# Patient Record
Sex: Female | Born: 1995 | Race: Black or African American | Hispanic: No | Marital: Single | Smoking: Former smoker
Health system: Southern US, Community
[De-identification: ages and names within clinical notes are randomized; demographics above are authoritative.]

## PROBLEM LIST (undated history)

## (undated) ENCOUNTER — Inpatient Hospital Stay (HOSPITAL_COMMUNITY): Payer: Self-pay

## (undated) DIAGNOSIS — J069 Acute upper respiratory infection, unspecified: Secondary | ICD-10-CM

## (undated) DIAGNOSIS — J302 Other seasonal allergic rhinitis: Secondary | ICD-10-CM

## (undated) DIAGNOSIS — J45909 Unspecified asthma, uncomplicated: Secondary | ICD-10-CM

## (undated) HISTORY — DX: Other seasonal allergic rhinitis: J30.2

## (undated) HISTORY — PX: DILATION AND CURETTAGE OF UTERUS: SHX78

## (undated) HISTORY — DX: Acute upper respiratory infection, unspecified: J06.9

## (undated) HISTORY — DX: Unspecified asthma, uncomplicated: J45.909

---

## 2017-11-26 ENCOUNTER — Ambulatory Visit: Payer: Self-pay | Admitting: Family Medicine

## 2017-11-26 DIAGNOSIS — Z0289 Encounter for other administrative examinations: Secondary | ICD-10-CM

## 2017-11-26 NOTE — Progress Notes (Deleted)
Patient: Joyce Howard MRN: [id number] DOB: [birth year] PCP: No primary care provider on file.     Subjective:  No chief complaint on file.   HPI: The patient is a 22 y.o. female who presents today for annual exam. {He/she (caps):30048} denies any changes to past medical history. There have been no recent hospitalizations. They {Actions; are/are not:16769} following a well balanced diet and exercise plan. Weight has been {trend:16658}. No complaints today.    There is no immunization history on file for this patient. Colonoscopy: Mammogram:  Pap smear:  PSA:   Review of Systems  Allergies Patient has no allergies on file.  Past Medical History Patient  has no past medical history on file.  Surgical History Patient  has no past surgical history on file.  Family History Pateint's family history is not on file.  Social History Patient      Objective: There were no vitals filed for this visit.  There is no height or weight on file to calculate BMI.  Physical Exam     Assessment/plan:    No follow-ups on file.     Orland Mustard, MD [facility]  11/26/2017

## 2017-12-09 ENCOUNTER — Encounter: Payer: Self-pay | Admitting: Family Medicine

## 2018-11-27 ENCOUNTER — Encounter (HOSPITAL_COMMUNITY): Payer: Self-pay

## 2018-11-27 ENCOUNTER — Emergency Department (HOSPITAL_COMMUNITY): Payer: No Typology Code available for payment source

## 2018-11-27 ENCOUNTER — Other Ambulatory Visit: Payer: Self-pay

## 2018-11-27 ENCOUNTER — Emergency Department (HOSPITAL_COMMUNITY)
Admission: EM | Admit: 2018-11-27 | Discharge: 2018-11-27 | Disposition: A | Discharge status: Home / Self Care | Payer: No Typology Code available for payment source | Attending: Emergency Medicine | Admitting: Emergency Medicine

## 2018-11-27 DIAGNOSIS — Y92410 Unspecified street and highway as the place of occurrence of the external cause: Secondary | ICD-10-CM | POA: Diagnosis not present

## 2018-11-27 DIAGNOSIS — M549 Dorsalgia, unspecified: Secondary | ICD-10-CM | POA: Diagnosis not present

## 2018-11-27 DIAGNOSIS — S161XXA Strain of muscle, fascia and tendon at neck level, initial encounter: Secondary | ICD-10-CM | POA: Diagnosis not present

## 2018-11-27 DIAGNOSIS — W2210XA Striking against or struck by unspecified automobile airbag, initial encounter: Secondary | ICD-10-CM | POA: Diagnosis not present

## 2018-11-27 DIAGNOSIS — Y9389 Activity, other specified: Secondary | ICD-10-CM | POA: Diagnosis not present

## 2018-11-27 DIAGNOSIS — S199XXA Unspecified injury of neck, initial encounter: Secondary | ICD-10-CM | POA: Diagnosis present

## 2018-11-27 DIAGNOSIS — Y998 Other external cause status: Secondary | ICD-10-CM | POA: Insufficient documentation

## 2018-11-27 MED ORDER — NAPROXEN 500 MG PO TABS: 500.0000 mg | ORAL_TABLET | Freq: Once | ORAL | Status: DC

## 2018-11-27 MED ORDER — METHOCARBAMOL 500 MG PO TABS: 500.0000 mg | ORAL_TABLET | Freq: Two times a day (BID) | ORAL | 0 refills | Status: DC | PRN

## 2018-11-27 MED ORDER — NAPROXEN 500 MG PO TABS: 500.0000 mg | ORAL_TABLET | Freq: Two times a day (BID) | ORAL | 0 refills | Status: DC

## 2018-11-27 MED ORDER — METHOCARBAMOL 500 MG PO TABS: 500.0000 mg | ORAL_TABLET | Freq: Once | ORAL | Status: DC

## 2018-11-27 NOTE — ED Notes (Signed)
Pt ambulated to and from X ray/CT.
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
## 2018-11-27 NOTE — Discharge Instructions (Addendum)
Alternate ice and heat to areas of injury 3-4 times per day to limit inflammation and spasm.  Avoid strenuous activity and heavy lifting.  We recommend consistent use of naproxen in addition to Robaxin for muscle spasms. Do not drive or drink alcohol after taking Robaxin as it may make you drowsy and impair your judgment.  We recommend follow-up with a primary care doctor to ensure resolution of symptoms.  Return to the ED for any new or concerning symptoms. 

## 2018-11-27 NOTE — ED Notes (Signed)
Pt requesting another C Collar. This RN explained that she does not need a C Collar based on the scan that she received. Her visitor was upset, but patient verbalized understanding and ambulated out of the ED.
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
## 2022-11-10 ENCOUNTER — Ambulatory Visit: Payer: Medicaid Other | Admitting: Obstetrics and Gynecology

## 2022-11-10 ENCOUNTER — Other Ambulatory Visit (HOSPITAL_COMMUNITY)
Admission: RE | Admit: 2022-11-10 | Discharge: 2022-11-10 | Disposition: A | Discharge status: Home / Self Care | Payer: Medicaid Other | Source: Ambulatory Visit | Attending: Obstetrics and Gynecology | Admitting: Obstetrics and Gynecology

## 2022-11-10 ENCOUNTER — Encounter: Payer: Self-pay | Admitting: Obstetrics and Gynecology

## 2022-11-10 VITALS — Wt 146.0 lb

## 2022-11-10 DIAGNOSIS — Z124 Encounter for screening for malignant neoplasm of cervix: Secondary | ICD-10-CM

## 2022-11-10 DIAGNOSIS — Z23 Encounter for immunization: Secondary | ICD-10-CM

## 2022-11-10 DIAGNOSIS — Z113 Encounter for screening for infections with a predominantly sexual mode of transmission: Secondary | ICD-10-CM

## 2022-11-10 DIAGNOSIS — Z01419 Encounter for gynecological examination (general) (routine) without abnormal findings: Secondary | ICD-10-CM

## 2022-11-10 NOTE — Patient Instructions (Signed)
Start folic acid 1 mg daily along with a prenatal vitamin.

## 2022-11-10 NOTE — Progress Notes (Signed)
GYNECOLOGY ANNUAL PREVENTATIVE CARE ENCOUNTER NOTE  History:     Joyce Howard is a 27 y.o. [id number] female here for a routine annual gynecologic exam.  Current complaints: None.   Denies abnormal vaginal bleeding, discharge, pelvic pain, problems with intercourse or other gynecologic concerns.   Trying to conceive at this time.     Gynecologic History Patient's last menstrual period was 11/03/2022. Contraception: none Last Pap: 2023. Result was normal with negative HPV. Pap done in [place], she has records with her today.    Obstetric History OB History  Gravida Para Term Preterm AB Living  1       1    SAB IAB Ectopic Multiple Live Births    1          # Outcome Date GA Lbr Len/2nd Weight Sex Type Anes PTL Lv  1 IAB             Past Medical History:  Diagnosis Date   Seasonal allergies     History reviewed. No pertinent surgical history.  Current Outpatient Medications on File Prior to Visit  Medication Sig Dispense Refill   acetaminophen (TYLENOL) 325 MG tablet Take 650 mg by mouth every 6 (six) hours as needed for moderate pain.     albuterol (VENTOLIN HFA) 108 (90 Base) MCG/ACT inhaler Inhale 2 puffs into the lungs every 6 (six) hours as needed for wheezing or shortness of breath.     cetirizine (ZYRTEC) 10 MG tablet      ergocalciferol (VITAMIN D2) 1.25 MG (50000 UT) capsule Take 50,000 Units by mouth once a week.     fluconazole (DIFLUCAN) 150 MG tablet SMARTSIG:1 Tablet(s) By Mouth Once a Week PRN     fluticasone (FLONASE) 50 MCG/ACT nasal spray Place into both nostrils daily.     vitamin B-12 (CYANOCOBALAMIN) 100 MCG tablet Take 100 mcg by mouth daily.     No current facility-administered medications on file prior to visit.    Allergies  Allergen Reactions   Mushroom Extract Complex Itching, Shortness Of Breath and Swelling   Other Hives, Shortness Of Breath and Swelling    Social History:  reports that she has never smoked. She has never used  smokeless tobacco. She reports that she does not currently use alcohol. She reports that she does not currently use drugs.  Family History  Problem Relation Age of Onset   Stroke Father     The following portions of the patient's history were reviewed and updated as appropriate: allergies, current medications, past family history, past medical history, past social history, past surgical history and problem list.  Review of Systems Pertinent items noted in HPI and remainder of comprehensive ROS otherwise negative.  Physical Exam:  Wt 146 lb (66.2 kg)   LMP 11/03/2022   BMI 25.06 kg/m  CONSTITUTIONAL: Well-developed, well-nourished female in no acute distress.  HENT:  Normocephalic, atraumatic, External right and left ear normal.  EYES: Conjunctivae and EOM are normal. Pupils are equal, round, and reactive to light. No scleral icterus.  NECK: Normal range of motion, supple, no masses.  Normal thyroid.  SKIN: Skin is warm and dry. No rash noted. Not diaphoretic. No erythema. No pallor. MUSCULOSKELETAL: Normal range of motion. No tenderness.  No cyanosis, clubbing, or edema. NEUROLOGIC: Alert and oriented to person, place, and time. Normal reflexes, muscle tone coordination.  PSYCHIATRIC: Normal mood and affect. Normal behavior. Normal judgment and thought content. CARDIOVASCULAR: Normal heart rate noted, regular rhythm RESPIRATORY: Clear to  auscultation bilaterally. Effort and breath sounds normal, no problems with respiration noted. BREASTS: Symmetric in size. No masses, tenderness, skin changes, nipple drainage, or lymphadenopathy bilaterally. Performed in the presence of a chaperone. ABDOMEN: Soft, no distention noted.  No tenderness, rebound or guarding.  PELVIC: Normal appearing external genitalia and urethral meatus; normal appearing vaginal mucosa and cervix.  Mildly enlarged left bartholin glad. No tenderness, redness or drainage. No abnormal vaginal discharge noted.  STD testing  collected. Performed in the presence of a chaperone.   Assessment and Plan:   1. Screening examination for STI  - Cervicovaginal ancillary only( [facility])  2. Women's annual routine gynecological examination  Pap not due today. Records reviewed.  Planning pregnancy, start prenatal vitamins and Folic acid.   Normal breast examination today, she was advised to perform periodic self breast examinations.   Routine preventative health maintenance measures emphasized. Please refer to After Visit Summary for other counseling recommendations.    Jami Bogdanski, Harolyn Rutherford, NP [facility] [employer], [facility]

## 2022-11-10 NOTE — Progress Notes (Signed)
Last pap- 06/05/21- negative Pt feels vaginal lump on the left side Has been TTC for 5 months

## 2022-11-11 LAB — CERVICOVAGINAL ANCILLARY ONLY
Chlamydia: NEGATIVE
Comment: NEGATIVE
Comment: NEGATIVE
Comment: NORMAL
Neisseria Gonorrhea: NEGATIVE
Trichomonas: NEGATIVE

## 2022-12-22 NOTE — Progress Notes (Unsigned)
New Patient Note  RE: Joyce Howard MRN: [id number] DOB: Nov 06, 1995 Date of Office Visit: 12/23/2022  Consult requested by: Laren Boom, DO Primary care provider: Patient, No Pcp Per  Chief Complaint: No chief complaint on file.  History of Present Illness: I had the pleasure of seeing Joyce Howard for initial evaluation at the [facility] on 12/22/2022. She is a 27 y.o. female, who is referred here by Patient, No Pcp Per for the evaluation of allergic rhinitis.  Discussed the use of AI scribe software for clinical note transcription with the patient, who gave verbal consent to proceed.  History of Present Illness             She reports symptoms of ***. Symptoms have been going on for *** years. The symptoms are present *** all year around with worsening in ***. Other triggers include exposure to ***. Anosmia: ***. Headache: ***. She has used *** with ***fair improvement in symptoms. Sinus infections: ***. Previous work up includes: ***. Previous ENT evaluation: ***. Previous sinus imaging: ***. History of nasal polyps: ***. Last eye exam: ***. History of reflux: ***.  Assessment and Plan: Joyce Howard is a 27 y.o. female with: ***  Assessment and Plan               No follow-ups on file.  No orders of the defined types were placed in this encounter.  Lab Orders  No laboratory test(s) ordered today    Other allergy screening: Asthma: {Blank single:19197::"yes","no"} Rhino conjunctivitis: {Blank single:19197::"yes","no"} Food allergy: {Blank single:19197::"yes","no"} Medication allergy: {Blank single:19197::"yes","no"} Hymenoptera allergy: {Blank single:19197::"yes","no"} Urticaria: {Blank single:19197::"yes","no"} Eczema:{Blank single:19197::"yes","no"} History of recurrent infections suggestive of immunodeficency: {Blank single:19197::"yes","no"}  Diagnostics: Spirometry:  Tracings reviewed. Her effort: {Blank  single:19197::"Good reproducible efforts.","It was hard to get consistent efforts and there is a question as to whether this reflects a maximal maneuver.","Poor effort, data can not be interpreted."} FVC: ***L FEV1: ***L, ***% predicted FEV1/FVC ratio: ***% Interpretation: {Blank single:19197::"Spirometry consistent with mild obstructive disease","Spirometry consistent with moderate obstructive disease","Spirometry consistent with severe obstructive disease","Spirometry consistent with possible restrictive disease","Spirometry consistent with mixed obstructive and restrictive disease","Spirometry uninterpretable due to technique","Spirometry consistent with normal pattern","No overt abnormalities noted given today's efforts"}.  Please see scanned spirometry results for details.  Skin Testing: {Blank single:19197::"Select foods","Environmental allergy panel","Environmental allergy panel and select foods","Food allergy panel","None","Deferred due to recent antihistamines use"}. *** Results discussed with patient/family.   Past Medical History: There are no problems to display for this patient.  Past Medical History:  Diagnosis Date   Seasonal allergies    Past Surgical History: No past surgical history on file. Medication List:  Current Outpatient Medications  Medication Sig Dispense Refill   acetaminophen (TYLENOL) 325 MG tablet Take 650 mg by mouth every 6 (six) hours as needed for moderate pain.     albuterol (VENTOLIN HFA) 108 (90 Base) MCG/ACT inhaler Inhale 2 puffs into the lungs every 6 (six) hours as needed for wheezing or shortness of breath.     cetirizine (ZYRTEC) 10 MG tablet      ergocalciferol (VITAMIN D2) 1.25 MG (50000 UT) capsule Take 50,000 Units by mouth once a week.     fluconazole (DIFLUCAN) 150 MG tablet SMARTSIG:1 Tablet(s) By Mouth Once a Week PRN     fluticasone (FLONASE) 50 MCG/ACT nasal spray Place into both nostrils daily.     vitamin B-12 (CYANOCOBALAMIN) 100  MCG tablet Take 100 mcg by mouth daily.     No current  facility-administered medications for this visit.   Allergies: Allergies  Allergen Reactions   Mushroom Extract Complex Itching, Shortness Of Breath and Swelling   Other Hives, Shortness Of Breath and Swelling   Social History: Social History   Socioeconomic History   Marital status: Single    Spouse name: Not on file   Number of children: Not on file   Years of education: Not on file   Highest education level: Not on file  Occupational History   Not on file  Tobacco Use   Smoking status: Never   Smokeless tobacco: Never  Vaping Use   Vaping status: Never Used  Substance and Sexual Activity   Alcohol use: Not Currently   Drug use: Not Currently   Sexual activity: Yes    Birth control/protection: None  Other Topics Concern   Not on file  Social History Narrative   Not on file   Social Determinants of Health   Financial Resource Strain: Not on file  Food Insecurity: Not on file  Transportation Needs: Not on file  Physical Activity: Not on file  Stress: Not on file  Social Connections: Not on file   Lives in a ***. Smoking: *** Occupation: ***  Environmental HistorySurveyor, minerals in the house: Copywriter, advertising in the family room: {Blank single:19197::"yes","no"} Carpet in the bedroom: {Blank single:19197::"yes","no"} Heating: {Blank single:19197::"electric","gas","heat pump"} Cooling: {Blank single:19197::"central","window","heat pump"} Pet: {Blank single:19197::"yes ***","no"}  Family History: Family History  Problem Relation Age of Onset   Stroke Father    Problem                               Relation Asthma                                   *** Eczema                                *** Food allergy                          *** Allergic rhino conjunctivitis     ***  Review of Systems  Constitutional:  Negative for appetite change, chills, fever and unexpected  weight change.  HENT:  Negative for congestion and rhinorrhea.   Eyes:  Negative for itching.  Respiratory:  Negative for cough, chest tightness, shortness of breath and wheezing.   Cardiovascular:  Negative for chest pain.  Gastrointestinal:  Negative for abdominal pain.  Genitourinary:  Negative for difficulty urinating.  Skin:  Negative for rash.  Neurological:  Negative for headaches.    Objective: There were no vitals taken for this visit. There is no height or weight on file to calculate BMI. Physical Exam Vitals and nursing note reviewed.  Constitutional:      Appearance: Normal appearance. She is well-developed.  HENT:     Head: Normocephalic and atraumatic.     Right Ear: Tympanic membrane and external ear normal.     Left Ear: Tympanic membrane and external ear normal.     Nose: Nose normal.     Mouth/Throat:     Mouth: Mucous membranes are moist.     Pharynx: Oropharynx is clear.  Eyes:     Conjunctiva/sclera: Conjunctivae normal.  Cardiovascular:     Rate  and Rhythm: Normal rate and regular rhythm.     Heart sounds: Normal heart sounds. No murmur heard.    No friction rub. No gallop.  Pulmonary:     Effort: Pulmonary effort is normal.     Breath sounds: Normal breath sounds. No wheezing, rhonchi or rales.  Musculoskeletal:     Cervical back: Neck supple.  Skin:    General: Skin is warm.     Findings: No rash.  Neurological:     Mental Status: She is alert and oriented to person, place, and time.  Psychiatric:        Behavior: Behavior normal.    The plan was reviewed with the patient/family, and all questions/concerned were addressed.  It was my pleasure to see Joyce Howard today and participate in her care. Please feel free to contact me with any questions or concerns.  Sincerely,  Wyline Mood, DO Allergy & Immunology  [facility] office: [phone] [facility] office: [phone]

## 2022-12-23 ENCOUNTER — Ambulatory Visit (INDEPENDENT_AMBULATORY_CARE_PROVIDER_SITE_OTHER): Payer: Medicaid Other | Admitting: Allergy

## 2022-12-23 ENCOUNTER — Other Ambulatory Visit: Payer: Self-pay

## 2022-12-23 ENCOUNTER — Encounter: Payer: Self-pay | Admitting: Allergy

## 2022-12-23 VITALS — BP 110/72 | HR 83 | Temp 98.3°F | Resp 12 | Ht 65.0 in | Wt 151.1 lb

## 2022-12-23 DIAGNOSIS — J329 Chronic sinusitis, unspecified: Secondary | ICD-10-CM

## 2022-12-23 DIAGNOSIS — J4530 Mild persistent asthma, uncomplicated: Secondary | ICD-10-CM | POA: Diagnosis not present

## 2022-12-23 DIAGNOSIS — J3089 Other allergic rhinitis: Secondary | ICD-10-CM | POA: Diagnosis not present

## 2022-12-23 DIAGNOSIS — T781XXD Other adverse food reactions, not elsewhere classified, subsequent encounter: Secondary | ICD-10-CM

## 2022-12-23 MED ORDER — FLUTICASONE FUROATE-VILANTEROL 100-25 MCG/ACT IN AEPB: 1.0000 | INHALATION_SPRAY | Freq: Every day | RESPIRATORY_TRACT | 3 refills | Status: DC

## 2022-12-23 MED ORDER — MONTELUKAST SODIUM 10 MG PO TABS: 10.0000 mg | ORAL_TABLET | Freq: Every day | ORAL | 2 refills | Status: DC

## 2022-12-23 NOTE — Patient Instructions (Addendum)
Rhinitis  Return for allergy skin testing. Will make additional recommendations based on results. If significant positives will recommend allergy injections - handout given. Make sure you don't take any antihistamines for 3 days before the skin testing appointment. Don't put any lotion on the back and arms on the day of testing.  Plan on being here for 30-60 minutes.  Use azelastine nasal spray 1-2 sprays per nostril twice a day as needed for runny nose/drainage. STOP this nasal spray 3 days before skin testing. Use Flonase (fluticasone) nasal spray 1-2 sprays per nostril once a day as needed for nasal congestion.  Nasal saline spray (i.e., Simply Saline) or nasal saline lavage (i.e., NeilMed) is recommended as needed and prior to medicated nasal sprays. Start Singulair (montelukast) 10mg  daily at night. Cautioned that in some children/adults can experience behavioral changes including hyperactivity, agitation, depression, sleep disturbances and suicidal ideations. These side effects are rare, but if you notice them you should notify me and discontinue Singulair (montelukast).  Infections Keep track of infections and antibiotics use. If persistent will get bloodwork next to look at immune system.   Asthma Start Singulair (montelukast) 10mg  daily at night as above.  Daily controller medication(s): start Breo 1 puff once a day and rinse mouth after each use. Demonstrated proper use.   May use albuterol rescue inhaler 2 puffs every 4 to 6 hours as needed for shortness of breath, chest tightness, coughing, and wheezing.  Monitor frequency of use - if you need to use it more than twice per week on a consistent basis let us know.  Breathing control goals:  Full participation in all desired activities (may need albuterol before activity) Albuterol use two times or less a week on average (not counting use with activity) Cough interfering with sleep two times or less a month Oral steroids  no more than once a year No hospitalizations   Food Avoid mushrooms. Check skin testing to mushroom at next visit. For mild symptoms you can take over the counter antihistamines such as Benadryl 1-2 tablets = 25-50mg  and monitor symptoms closely.  If symptoms worsen or if you have severe symptoms including breathing issues, throat closure, significant swelling, whole body hives, severe diarrhea and vomiting, lightheadedness then seek immediate medical care.  Follow up for skin testing with me.

## 2022-12-28 ENCOUNTER — Other Ambulatory Visit (HOSPITAL_COMMUNITY): Payer: Self-pay

## 2022-12-28 ENCOUNTER — Telehealth: Payer: Self-pay

## 2022-12-28 MED ORDER — BUDESONIDE-FORMOTEROL FUMARATE 80-4.5 MCG/ACT IN AERO: 2.0000 | INHALATION_SPRAY | Freq: Two times a day (BID) | RESPIRATORY_TRACT | 5 refills | Status: DC

## 2022-12-28 NOTE — Telephone Encounter (Signed)
Pharmacy Patient Advocate Encounter   Received notification from CoverMyMeds that prior authorization for Fluticasone Furoate-Vilanterol 100-25MCG/ACT aerosol powder is required/requested.   Insurance verification completed.   The patient is insured through [facility]   Prior Authorization form/request asks a question that requires your assistance. Please see the question below and advise accordingly.    KEY [id number]

## 2022-12-28 NOTE — Telephone Encounter (Signed)
Joyce Howard is not covered.  Please call patient and tell her that I sent in Symbicort 2 puffs twice a day and rinse mouth after each use.

## 2022-12-29 NOTE — Telephone Encounter (Signed)
Called patient - DOB/Pharmacy verified - advised of provider notation below.  Patient verbalized understanding, no questions.

## 2022-12-29 NOTE — Progress Notes (Unsigned)
Skin testing note  RE: Joyce Howard MRN: [id number] DOB: June 26, 1995 Date of Office Visit: 12/30/2022  Referring provider: No ref. provider found Primary care provider: Patient, No Pcp Per  Chief Complaint: No chief complaint on file.  History of Present Illness: I had the pleasure of seeing Joyce Howard for a skin testing visit at the [facility] on 12/29/2022. She is a 27 y.o. female, who is being followed for allergic rhinitis, recurrent sinusitis, asthma, adverse food reaction. Her previous allergy office visit was on 12/23/2022 with Dr. Selena Batten. Today is a skin testing visit.   Discussed the use of AI scribe software for clinical note transcription with the patient, who gave verbal consent to proceed.  History of Present Illness             Other allergic rhinitis Persistent nasal congestion, postnasal drip, and facial pain for the past year. Recurrent sinus infections and bronchitis. Nasal endoscopy revealed nasal swelling. Current treatment with Flonase and azelastine nasal sprays not providing relief. Return for allergy skin testing. Will make additional recommendations based on results. If significant positives will recommend allergy injections - handout given. Use azelastine nasal spray 1-2 sprays per nostril twice a day as needed for runny nose/drainage. STOP this nasal spray 3 days before skin testing. Use Flonase (fluticasone) nasal spray 1-2 sprays per nostril once a day as needed for nasal congestion.  Nasal saline spray (i.e., Simply Saline) or nasal saline lavage (i.e., NeilMed) is recommended as needed and prior to medicated nasal sprays. Start Singulair (montelukast) 10mg  daily at night. Cautioned that in some children/adults can experience behavioral changes including hyperactivity, agitation, depression, sleep disturbances and suicidal ideations. These side effects are rare, but if you notice them you should notify me and discontinue  Singulair (montelukast).   Recurrent sinusitis Keep track of infections and antibiotics use. If persistent will get bloodwork next to look at immune system.    Mild persistent asthma without complication Reports of wheezing and use of albuterol inhaler up to three times a week. Seems to have gotten worse since she had Covid-19 4 times. Today's spirometry showed normal pattern with 4% (150cc) improvement in FEV1 post bronchodilator treatment. Clinically feeling improved.  Start Singulair (montelukast) 10mg  daily at night as above.  Daily controller medication(s): start Breo 1 puff once a day and rinse mouth after each use. Demonstrated proper use.  May use albuterol rescue inhaler 2 puffs every 4 to 6 hours as needed for shortness of breath, chest tightness, coughing, and wheezing.  Monitor frequency of use - if you need to use it more than twice per week on a consistent basis let us know.    Other adverse food reactions, not elsewhere classified, subsequent encounter Reports of systemic reaction including facial swelling and throat tightness. Last reaction in 2017. Avoid mushrooms. Check skin testing to mushroom at next visit. For mild symptoms you can take over the counter antihistamines such as Benadryl 1-2 tablets = 25-50mg  and monitor symptoms closely.  If symptoms worsen or if you have severe symptoms including breathing issues, throat closure, significant swelling, whole body hives, severe diarrhea and vomiting, lightheadedness then seek immediate medical care. Assessment and Plan: Joyce Howard is a 27 y.o. female with: ***  Assessment and Plan              No follow-ups on file.  No orders of the defined types were placed in this encounter.  Lab Orders  No laboratory test(s) ordered today  Diagnostics: Spirometry:  Tracings reviewed. Her effort: {Blank single:19197::"Good reproducible efforts.","It was hard to get consistent efforts and there is a question as to  whether this reflects a maximal maneuver.","Poor effort, data can not be interpreted."} FVC: ***L FEV1: ***L, ***% predicted FEV1/FVC ratio: ***% Interpretation: {Blank single:19197::"Spirometry consistent with mild obstructive disease","Spirometry consistent with moderate obstructive disease","Spirometry consistent with severe obstructive disease","Spirometry consistent with possible restrictive disease","Spirometry consistent with mixed obstructive and restrictive disease","Spirometry uninterpretable due to technique","Spirometry consistent with normal pattern","No overt abnormalities noted given today's efforts"}.  Please see scanned spirometry results for details.  Skin Testing: Environmental allergy panel and select foods. *** Results discussed with patient/family.   Previous notes and tests were reviewed. The plan was reviewed with the patient/family, and all questions/concerned were addressed.  It was my pleasure to see Joyce Howard today and participate in her care. Please feel free to contact me with any questions or concerns.  Sincerely,  Wyline Mood, DO Allergy & Immunology  [facility] office: [phone] [facility] office: [phone]

## 2022-12-30 ENCOUNTER — Encounter: Payer: Self-pay | Admitting: Allergy

## 2022-12-30 ENCOUNTER — Ambulatory Visit (INDEPENDENT_AMBULATORY_CARE_PROVIDER_SITE_OTHER): Payer: Medicaid Other | Admitting: Allergy

## 2022-12-30 DIAGNOSIS — J3081 Allergic rhinitis due to animal (cat) (dog) hair and dander: Secondary | ICD-10-CM

## 2022-12-30 DIAGNOSIS — J3089 Other allergic rhinitis: Secondary | ICD-10-CM

## 2022-12-30 DIAGNOSIS — T781XXD Other adverse food reactions, not elsewhere classified, subsequent encounter: Secondary | ICD-10-CM

## 2022-12-30 DIAGNOSIS — J329 Chronic sinusitis, unspecified: Secondary | ICD-10-CM

## 2022-12-30 DIAGNOSIS — J4530 Mild persistent asthma, uncomplicated: Secondary | ICD-10-CM

## 2022-12-30 DIAGNOSIS — J301 Allergic rhinitis due to pollen: Secondary | ICD-10-CM

## 2022-12-30 NOTE — Patient Instructions (Addendum)
Today's skin testing positive to grass, mold, cat and borderline to dog. Negative to mushroom.   Results given.  Environmental allergies Start environmental control measures as below. Use over the counter antihistamines such as Zyrtec (cetirizine), Claritin (loratadine), Allegra (fexofenadine), or Xyzal (levocetirizine) daily as needed. May take twice a day during allergy flares. May switch antihistamines every few months. Continue Singulair (montelukast) 10mg  daily at night.  Hold nasal sprays if you have some blood tinged nasal discharge.  Use azelastine nasal spray 1-2 sprays per nostril twice a day as needed for runny nose/drainage. Use Flonase (fluticasone) nasal spray 1-2 sprays per nostril once a day as needed for nasal congestion.  Nasal saline spray (i.e., Simply Saline) or nasal saline lavage (i.e., NeilMed) is recommended as needed and prior to medicated nasal sprays.  Consider allergy injections for long term control if above medications do not help the symptoms.   Nose Bleeds: Nosebleeds are very common.  Site of the bleeding is typically on the septum or at the very front of the nose.  Some of the more common causes are from trauma, inflammation or medication induced. Pinch both nostrils while leaning forward for at least 5 minutes before checking to see if the bleeding has stopped. If bleeding is not controlled within 5-10 minutes apply a cotton ball soaked with oxymetazoline (Afrin) to the bleeding nostril for a few seconds.  Preventative treatment: Apply saline nasal gel in each nostril twice a day for 2 weeks to allow the nasal mucosa to heal Consider using a humidifier in the winter Try to keep your blood pressure as normal as possible (120/80)  Infections Keep track of infections and antibiotics use. If persistent will get bloodwork next to look at immune system.   Asthma Continue Singulair (montelukast) 10mg  daily at night as above.  Daily controller medication(s):  start Symbicort 2 puffs twice a day and rinse mouth after each use.  May use albuterol rescue inhaler 2 puffs every 4 to 6 hours as needed for shortness of breath, chest tightness, coughing, and wheezing.  Monitor frequency of use - if you need to use it more than twice per week on a consistent basis let us know.  Breathing control goals:  Full participation in all desired activities (may need albuterol before activity) Albuterol use two times or less a week on average (not counting use with activity) Cough interfering with sleep two times or less a month Oral steroids no more than once a year No hospitalizations   Food Continue to avoid mushrooms. Will get bloodwork for mushrooms at next visit.  For mild symptoms you can take over the counter antihistamines such as Benadryl 1-2 tablets = 25-50mg  and monitor symptoms closely.  If symptoms worsen or if you have severe symptoms including breathing issues, throat closure, significant swelling, whole body hives, severe diarrhea and vomiting, lightheadedness then seek immediate medical care.  Follow up in 3 months or sooner if needed.   Reducing Pollen Exposure Pollen seasons: trees (spring), grass (summer) and ragweed/weeds (fall). Keep windows closed in your home and car to lower pollen exposure.  Install air conditioning in the bedroom and throughout the house if possible.  Avoid going out in dry windy days - especially early morning. Pollen counts are highest between 5 - 10 AM and on dry, hot and windy days.  Save outside activities for late afternoon or after a heavy rain, when pollen levels are lower.  Avoid mowing of grass if you have grass pollen allergy.  Be aware that pollen can also be transported indoors on people and pets.  Dry your clothes in an automatic dryer rather than hanging them outside where they might collect pollen.  Rinse hair and eyes before bedtime.  Pet Allergen Avoidance: Contrary to popular opinion, there  are no "hypoallergenic" breeds of dogs or cats. That is because people are not allergic to an animal's hair, but to an allergen found in the animal's saliva, dander (dead skin flakes) or urine. Pet allergy symptoms typically occur within minutes. For some people, symptoms can build up and become most severe 8 to 12 hours after contact with the animal. People with severe allergies can experience reactions in public places if dander has been transported on the pet owners' clothing. Keeping an animal outdoors is only a partial solution, since homes with pets in the yard still have higher concentrations of animal allergens. Before getting a pet, ask your allergist to determine if you are allergic to animals. If your pet is already considered part of your family, try to minimize contact and keep the pet out of the bedroom and other rooms where you spend a great deal of time. As with dust mites, vacuum carpets often or replace carpet with a hardwood floor, tile or linoleum. High-efficiency particulate air (HEPA) cleaners can reduce allergen levels over time. While dander and saliva are the source of cat and dog allergens, urine is the source of allergens from rabbits, hamsters, mice and [place] pigs; so ask a non-allergic family member to clean the animal's cage. If you have a pet allergy, talk to your allergist about the potential for allergy immunotherapy (allergy shots). This strategy can often provide long-term relief.  Mold Control Mold and fungi can grow on a variety of surfaces provided certain temperature and moisture conditions exist.  Outdoor molds grow on plants, decaying vegetation and soil. The major outdoor mold, Alternaria and Cladosporium, are found in very high numbers during hot and dry conditions. Generally, a late summer - fall peak is seen for common outdoor fungal spores. Rain will temporarily lower outdoor mold spore count, but counts rise rapidly when the rainy period ends. The most  important indoor molds are Aspergillus and Penicillium. Dark, humid and poorly ventilated basements are ideal sites for mold growth. The next most common sites of mold growth are the bathroom and the kitchen. Outdoor (Seasonal) Mold Control Use air conditioning and keep windows closed. Avoid exposure to decaying vegetation. Avoid leaf raking. Avoid grain handling. Consider wearing a face mask if working in moldy areas.  Indoor (Perennial) Mold Control  Maintain humidity below 50%. Get rid of mold growth on hard surfaces with water, detergent and, if necessary, 5% bleach (do not mix with other cleaners). Then dry the area completely. If mold covers an area more than 10 square feet, consider hiring an indoor environmental professional. For clothing, washing with soap and water is best. If moldy items cannot be cleaned and dried, throw them away. Remove sources e.g. contaminated carpets. Repair and seal leaking roofs or pipes. Using dehumidifiers in damp basements may be helpful, but empty the water and clean units regularly to prevent mildew from forming. All rooms, especially basements, bathrooms and kitchens, require ventilation and cleaning to deter mold and mildew growth. Avoid carpeting on concrete or damp floors, and storing items in damp areas.

## 2022-12-31 ENCOUNTER — Telehealth: Payer: Self-pay

## 2022-12-31 NOTE — Telephone Encounter (Signed)
Patient was seen yesterday by Dr. Selena Batten in the [place] office. Patient stating where her intradermals were done that her arm felt itchy,warm,hot, so I told patient to apply cool compress to her arm, take benadryl every 4-6 hours, apply hydrocortisone 1% cream. Told patient to call office tomorrow if her above symptoms aren't getting any better.

## 2023-01-21 ENCOUNTER — Ambulatory Visit: Payer: Medicaid Other | Admitting: Obstetrics and Gynecology

## 2023-01-21 ENCOUNTER — Other Ambulatory Visit (HOSPITAL_COMMUNITY)
Admission: RE | Admit: 2023-01-21 | Discharge: 2023-01-21 | Disposition: A | Discharge status: Home / Self Care | Payer: Medicaid Other | Source: Ambulatory Visit | Attending: Obstetrics and Gynecology | Admitting: Obstetrics and Gynecology

## 2023-01-21 ENCOUNTER — Encounter: Payer: Self-pay | Admitting: Obstetrics and Gynecology

## 2023-01-21 VITALS — BP 119/79 | HR 84 | Ht 65.0 in | Wt 152.0 lb

## 2023-01-21 DIAGNOSIS — Z113 Encounter for screening for infections with a predominantly sexual mode of transmission: Secondary | ICD-10-CM

## 2023-01-21 DIAGNOSIS — R102 Pelvic and perineal pain: Secondary | ICD-10-CM | POA: Diagnosis not present

## 2023-01-21 LAB — CERVICOVAGINAL ANCILLARY ONLY
Chlamydia: NEGATIVE
Comment: NEGATIVE
Comment: NEGATIVE
Comment: NORMAL
Neisseria Gonorrhea: NEGATIVE
Trichomonas: NEGATIVE

## 2023-01-21 NOTE — Patient Instructions (Addendum)
 It was nice meeting you today! You will see your results in the MyChart app within a week.   Please call next time you get one of these cuts so we can test for herpes.   Apply a thin layer of coconut oil, vaseline, or lubricant around the entire genital area before intercourse to prevent tearing

## 2023-01-21 NOTE — Progress Notes (Signed)
   RETURN GYNECOLOGY VISIT  Subjective:  Joyce Howard is a 28 y.o. G1P0010 with LMP 01/17/23 presenting for vaginal trauma after intercourse  Reports recurrent genital cuts post-sexual intercourse. These cuts were painful and are taking longer to heal. No current lesions. The patient uses lubricant during intercourse and does not report any dryness. She has a photo on her phone that shows a small laceration near her clitoris and a potential ulceration near her clitoris.  Also has concerns about potential exposure to Hepatitis C from her partner. The patient reports that her partner had oral sex with a woman known to have Hepatitis C. Requests screening for all STIs  Objective:   Vitals:   01/21/23 0957  BP: 119/79  Pulse: 84  Weight: 152 lb (68.9 kg)  Height: 5' 5 (1.651 m)   General:  Alert, oriented and cooperative. Patient is in no acute distress.  Skin: Skin is warm and dry. No rash noted.   Cardiovascular: Normal heart rate noted  Respiratory: Normal respiratory effort, no problems with respiration noted  Abdomen: Soft, non-tender, non-distended   Pelvic: NEFG. No rash, lesion or ulcer.   Exam performed in the presence of a chaperone  Assessment and Plan:  Joyce Howard is a 28 y.o. with vulvar trauma after intercourse  Vulvar pain/trauma after intercourse - Photo on phone is difficult to interpret especially because no lesions are present today. Recommend she returns for care when she has a lesion so it can be swabbed for HSV - Recommend emollient, coconut oil, or lubricant over the clitoris and entire vulva prior/during sexual activity to provide barrier protection and prevent recurrence if this is related to trauma/friction during IC  Screening examination for STI -     Cervicovaginal ancillary only( [place]) -     HIV antibody (with reflex) -     RPR -     HCV Ab w Reflex to Quant PCR -     Hepatitis B Surface AntiGEN  RTC PRN  Kieth JAYSON Carolin,  MD

## 2023-01-22 LAB — HCV AB W REFLEX TO QUANT PCR: HCV Ab: NONREACTIVE

## 2023-01-22 LAB — HCV INTERPRETATION

## 2023-01-22 LAB — HEPATITIS B SURFACE ANTIGEN: Hepatitis B Surface Ag: NEGATIVE

## 2023-01-22 LAB — HIV ANTIBODY (ROUTINE TESTING W REFLEX): HIV Screen 4th Generation wRfx: NONREACTIVE

## 2023-01-22 LAB — RPR: RPR Ser Ql: NONREACTIVE

## 2023-02-16 ENCOUNTER — Ambulatory Visit: Payer: Medicaid Other | Admitting: Obstetrics and Gynecology

## 2023-02-16 ENCOUNTER — Other Ambulatory Visit (HOSPITAL_COMMUNITY)
Admission: RE | Admit: 2023-02-16 | Discharge: 2023-02-16 | Disposition: A | Discharge status: Home / Self Care | Payer: Medicaid Other | Source: Ambulatory Visit | Attending: Obstetrics and Gynecology | Admitting: Obstetrics and Gynecology

## 2023-02-16 VITALS — BP 117/80 | HR 84 | Ht 65.0 in | Wt 143.0 lb

## 2023-02-16 DIAGNOSIS — N898 Other specified noninflammatory disorders of vagina: Secondary | ICD-10-CM | POA: Diagnosis present

## 2023-02-16 DIAGNOSIS — B3731 Acute candidiasis of vulva and vagina: Secondary | ICD-10-CM | POA: Diagnosis not present

## 2023-02-16 DIAGNOSIS — Z113 Encounter for screening for infections with a predominantly sexual mode of transmission: Secondary | ICD-10-CM | POA: Diagnosis present

## 2023-02-16 MED ORDER — FLUCONAZOLE 150 MG PO TABS: 150.0000 mg | ORAL_TABLET | Freq: Every day | ORAL | 0 refills | Status: DC

## 2023-02-16 MED ORDER — CLOTRIMAZOLE 1 % EX CREA: 1.0000 | TOPICAL_CREAM | Freq: Two times a day (BID) | CUTANEOUS | 0 refills | Status: DC

## 2023-02-16 NOTE — Progress Notes (Signed)
fl   GYNECOLOGY ENCOUNTER NOTE  History:     Joyce Howard is a 28 y.o. G56P0010 female here for a routine annual gynecologic exam.  Current complaints: vaginal itching, vaginal irritation, cuts noted on vagina.    Was seen recently in the office and recommended to come back with cuts were present. She reports on 1/23 she used hair removal cream and did shave, she feels the cuts on her vagina following treatment..Symptoms got worse after hair removal cream. Intense itching on inside and outside of vagina    Obstetric History OB History  Gravida Para Term Preterm AB Living  1    1   SAB IAB Ectopic Multiple Live Births   1       # Outcome Date GA Lbr Len/2nd Weight Sex Type Anes PTL Lv  1 IAB             Past Medical History:  Diagnosis Date   Asthma    Recurrent upper respiratory infection (URI)    Seasonal allergies     No past surgical history on file.  Current Outpatient Medications on File Prior to Visit  Medication Sig Dispense Refill   albuterol (VENTOLIN HFA) 108 (90 Base) MCG/ACT inhaler Inhale 2 puffs into the lungs every 6 (six) hours as needed for wheezing or shortness of breath.     azelastine (ASTELIN) 0.1 % nasal spray Place 2 sprays into both nostrils 2 (two) times daily. Use in each nostril as directed     budesonide-formoterol (SYMBICORT) 80-4.5 MCG/ACT inhaler Inhale 2 puffs into the lungs 2 (two) times daily. Rinse mouth after each use. 1 each 5   fluticasone (FLONASE) 50 MCG/ACT nasal spray Place into both nostrils daily.     montelukast (SINGULAIR) 10 MG tablet Take 1 tablet (10 mg total) by mouth at bedtime. 30 tablet 2   omeprazole (PRILOSEC) 40 MG capsule Take 40 mg by mouth daily.     Phenylephrine-Pheniramine-DM (THERAFLU COLD & COUGH PO) Take by mouth.     vitamin B-12 (CYANOCOBALAMIN) 100 MCG tablet Take 100 mcg by mouth daily.     No current facility-administered medications on file prior to visit.    Allergies  Allergen Reactions    Mushroom Extract Complex (Do Not Select) Itching, Shortness Of Breath and Swelling   Other Hives, Shortness Of Breath and Swelling    Social History:  reports that she has quit smoking. Her smoking use included cigarettes. She has never used smokeless tobacco. She reports that she does not currently use alcohol. She reports that she does not currently use drugs.  Family History  Problem Relation Age of Onset   Stroke Father    Asthma Sister    Allergic rhinitis Neg Hx    Angioedema Neg Hx    Eczema Neg Hx    Urticaria Neg Hx     The following portions of the patient's history were reviewed and updated as appropriate: allergies, current medications, past family history, past medical history, past social history, past surgical history and problem list.  Review of Systems Pertinent items noted in HPI and remainder of comprehensive ROS otherwise negative.  Physical Exam:  BP 117/80   Pulse 84   Ht 5\' 5"  (1.651 m)   Wt 143 lb (64.9 kg)   LMP 01/17/2023   BMI 23.80 kg/m  CONSTITUTIONAL: Well-developed, well-nourished female in no acute distress.  HENT:  Normocephalic NECK: Normal range of motion SKIN: Skin is warm and dry.  NEUROLOGIC: Alert and oriented  to person GU: Large amount of yeast patches near vulva, introitus. Skin discoloration noted from scratching. Small cuts near vulva, no HSV lesions noted. HSV swab collected to cover all bases.  GC/Chlam, wet prep done collected  Chaperone present for exam.     Assessment and Plan:   1. Screening examination for STI (Primary)  - Cervicovaginal ancillary only( [place]) - Herpes simplex virus culture  2. Vaginal irritation  - Cervicovaginal ancillary only( [place]) - Herpes simplex virus culture  3. Vaginal yeast infection  Rx: Diflucan x 2 doses Clobetasol for external use only.    Keedan Sample, Harolyn Rutherford, NP [facility] [employer], [facility]

## 2023-02-17 LAB — CERVICOVAGINAL ANCILLARY ONLY
Bacterial Vaginitis (gardnerella): NEGATIVE
Candida Glabrata: NEGATIVE
Candida Vaginitis: POSITIVE — AB
Chlamydia: NEGATIVE
Comment: NEGATIVE
Comment: NEGATIVE
Comment: NEGATIVE
Comment: NEGATIVE
Comment: NEGATIVE
Comment: NORMAL
Neisseria Gonorrhea: NEGATIVE
Trichomonas: NEGATIVE

## 2023-02-18 LAB — HERPES SIMPLEX VIRUS CULTURE

## 2023-08-07 ENCOUNTER — Emergency Department (HOSPITAL_COMMUNITY)
Admission: EM | Admit: 2023-08-07 | Discharge: 2023-08-07 | Disposition: A | Discharge status: Home / Self Care | Attending: Emergency Medicine | Admitting: Emergency Medicine

## 2023-08-07 ENCOUNTER — Encounter: Payer: Self-pay | Admitting: Obstetrics and Gynecology

## 2023-08-07 ENCOUNTER — Other Ambulatory Visit: Payer: Self-pay

## 2023-08-07 ENCOUNTER — Emergency Department (HOSPITAL_COMMUNITY)

## 2023-08-07 DIAGNOSIS — Z3A01 Less than 8 weeks gestation of pregnancy: Secondary | ICD-10-CM | POA: Diagnosis not present

## 2023-08-07 DIAGNOSIS — Z87891 Personal history of nicotine dependence: Secondary | ICD-10-CM | POA: Insufficient documentation

## 2023-08-07 DIAGNOSIS — O0001 Abdominal pregnancy with intrauterine pregnancy: Secondary | ICD-10-CM | POA: Insufficient documentation

## 2023-08-07 DIAGNOSIS — J45909 Unspecified asthma, uncomplicated: Secondary | ICD-10-CM | POA: Insufficient documentation

## 2023-08-07 DIAGNOSIS — O219 Vomiting of pregnancy, unspecified: Secondary | ICD-10-CM | POA: Diagnosis not present

## 2023-08-07 DIAGNOSIS — Z7951 Long term (current) use of inhaled steroids: Secondary | ICD-10-CM | POA: Diagnosis not present

## 2023-08-07 DIAGNOSIS — Z3491 Encounter for supervision of normal pregnancy, unspecified, first trimester: Secondary | ICD-10-CM

## 2023-08-07 LAB — COMPREHENSIVE METABOLIC PANEL WITH GFR
ALT: 11 U/L (ref 0–44)
AST: 13 U/L — ABNORMAL LOW (ref 15–41)
Albumin: 3.7 g/dL (ref 3.5–5.0)
Alkaline Phosphatase: 45 U/L (ref 38–126)
Anion gap: 9 (ref 5–15)
BUN: 8 mg/dL (ref 6–20)
CO2: 21 mmol/L — ABNORMAL LOW (ref 22–32)
Calcium: 9 mg/dL (ref 8.9–10.3)
Chloride: 103 mmol/L (ref 98–111)
Creatinine, Ser: 0.39 mg/dL — ABNORMAL LOW (ref 0.44–1.00)
GFR, Estimated: >60 mL/min (ref >60)
Glucose, Bld: 102 mg/dL — ABNORMAL HIGH (ref 70–99)
Potassium: 3.5 mmol/L (ref 3.5–5.1)
Sodium: 133 mmol/L — ABNORMAL LOW (ref 135–145)
Total Bilirubin: 0.8 mg/dL (ref 0.0–1.2)
Total Protein: 7.8 g/dL (ref 6.5–8.1)

## 2023-08-07 LAB — CBC WITH DIFFERENTIAL/PLATELET
Abs Immature Granulocytes: 0.01 K/uL (ref 0.00–0.07)
Basophils Absolute: 0 K/uL (ref 0.0–0.1)
Basophils Relative: 1 %
Eosinophils Absolute: 0.4 K/uL (ref 0.0–0.5)
Eosinophils Relative: 7 %
HCT: 38.9 % (ref 36.0–46.0)
Hemoglobin: 12.1 g/dL (ref 12.0–15.0)
Immature Granulocytes: 0 %
Lymphocytes Relative: 42 %
Lymphs Abs: 2.6 K/uL (ref 0.7–4.0)
MCH: 26.1 pg (ref 26.0–34.0)
MCHC: 31.1 g/dL (ref 30.0–36.0)
MCV: 83.8 fL (ref 80.0–100.0)
Monocytes Absolute: 0.6 K/uL (ref 0.1–1.0)
Monocytes Relative: 10 %
Neutro Abs: 2.5 K/uL (ref 1.7–7.7)
Neutrophils Relative %: 40 %
Platelets: 377 K/uL (ref 150–400)
RBC: 4.64 MIL/uL (ref 3.87–5.11)
RDW: 16.5 % — ABNORMAL HIGH (ref 11.5–15.5)
WBC: 6.2 K/uL (ref 4.0–10.5)
nRBC: 0 % (ref 0.0–0.2)

## 2023-08-07 LAB — URINALYSIS, ROUTINE W REFLEX MICROSCOPIC
Bilirubin Urine: NEGATIVE
Glucose, UA: NEGATIVE mg/dL
Hgb urine dipstick: NEGATIVE
Ketones, ur: NEGATIVE mg/dL
Leukocytes,Ua: NEGATIVE
Nitrite: NEGATIVE
Protein, ur: NEGATIVE mg/dL
Specific Gravity, Urine: 1.024 (ref 1.005–1.030)
pH: 6 (ref 5.0–8.0)

## 2023-08-07 LAB — LIPASE, BLOOD: Lipase: 32 U/L (ref 11–51)

## 2023-08-07 LAB — HCG, QUANTITATIVE, PREGNANCY: hCG, Beta Chain, Quant, S: 55505 m[IU]/mL — ABNORMAL HIGH (ref <5)

## 2023-08-07 LAB — HCG, SERUM, QUALITATIVE: Preg, Serum: POSITIVE — AB

## 2023-08-07 NOTE — Discharge Instructions (Addendum)
 We evaluated you for your cramping.  Your ultrasound does show a 7-week pregnancy with a fetal heartbeat.  Your cramping may be due an ovarian cyst.  We feel that is safe to go home at this time.  Please follow-up with your OB as scheduled on Thursday.  If you have any new or worsening symptoms such as increasing pain, lightheadedness or dizziness, fainting, vomiting, fevers or chills, or any other new symptoms, please return to the emergency department.

## 2023-08-07 NOTE — ED Triage Notes (Signed)
 Pt reports abd cramping since early this morning. Estimating [redacted] weeks pregnant. Denies bleeding, dysuria.

## 2023-08-07 NOTE — ED Provider Notes (Signed)
 [place] [facility] DEPARTMENT AT [facility] Provider Note  CSN: [id number] Arrival date & time: 08/07/23 1244  Chief Complaint(s) Abdominal Pain  HPI Joyce Howard is a 28 y.o. female history of asthma presenting to the emergency department with abdominal cramping.  Patient reports she is about [redacted] weeks pregnant by last menstrual cycle.  Had positive pregnancy test at home.  Reports lower abdominal cramping.  Has also had some nausea and vomiting occasionally.  Cramping began last night.  No fevers or chills.  No vaginal bleeding, vaginal discharge.  No diarrhea.  No other new symptoms.   Past Medical History Past Medical History:  Diagnosis Date   Asthma    Recurrent upper respiratory infection (URI)    Seasonal allergies    There are no active problems to display for this patient.  Home Medication(s) Prior to Admission medications   Medication Sig Start Date End Date Taking? Authorizing Provider  albuterol (VENTOLIN HFA) 108 (90 Base) MCG/ACT inhaler Inhale 2 puffs into the lungs every 6 (six) hours as needed for wheezing or shortness of breath.    [provider]  azelastine (ASTELIN) 0.1 % nasal spray Place 2 sprays into both nostrils 2 (two) times daily. Use in each nostril as directed    [provider]  budesonide -formoterol  (SYMBICORT ) 80-4.5 MCG/ACT inhaler Inhale 2 puffs into the lungs 2 (two) times daily. Rinse mouth after each use. 12/28/22   Luke Orlan HERO, DO  clotrimazole  (LOTRIMIN ) 1 % cream Apply 1 Application topically 2 (two) times daily. 02/16/23   Rasch, Delon I, NP  fluconazole  (DIFLUCAN ) 150 MG tablet Take 1 tablet (150 mg total) by mouth daily. Take one dose today and repeat in 3 days. 02/16/23   Rasch, Delon I, NP  fluticasone  (FLONASE) 50 MCG/ACT nasal spray Place into both nostrils daily.    [provider]  montelukast  (SINGULAIR ) 10 MG tablet Take 1 tablet (10 mg total) by mouth at bedtime. 12/23/22   Luke Orlan HERO,  DO  omeprazole (PRILOSEC) 40 MG capsule Take 40 mg by mouth daily.    [provider]  Phenylephrine-Pheniramine-DM [facility] COLD & COUGH PO) Take by mouth.    [provider]  vitamin B-12 (CYANOCOBALAMIN) 100 MCG tablet Take 100 mcg by mouth daily.    [provider]                                                                                                                                    Past Surgical History No past surgical history on file. Family History Family History  Problem Relation Age of Onset   Stroke Father    Asthma Sister    Allergic rhinitis Neg Hx    Angioedema Neg Hx    Eczema Neg Hx    Urticaria Neg Hx     Social History Social History   Tobacco Use   Smoking status: Former  Types: Cigarettes   Smokeless tobacco: Never  Vaping Use   Vaping status: Never Used  Substance Use Topics   Alcohol use: Not Currently   Drug use: Not Currently   Allergies Mushroom extract complex (obsolete) and Other  Review of Systems Review of Systems  All other systems reviewed and are negative.   Physical Exam Vital Signs  I have reviewed the triage vital signs BP 118/74 (BP Location: Left Arm)   Pulse 90   Temp 97.6 F (36.4 C)   Resp 18   Ht 5' 5 (1.651 m)   Wt 65.3 kg   LMP 06/14/2023 (Exact Date)   SpO2 100%   BMI 23.96 kg/m  Physical Exam Vitals and nursing note reviewed.  Constitutional:      General: She is not in acute distress.    Appearance: She is well-developed.  HENT:     Head: Normocephalic and atraumatic.     Mouth/Throat:     Mouth: Mucous membranes are moist.  Eyes:     Pupils: Pupils are equal, round, and reactive to light.  Cardiovascular:     Rate and Rhythm: Normal rate and regular rhythm.     Heart sounds: No murmur heard. Pulmonary:     Effort: Pulmonary effort is normal. No respiratory distress.     Breath sounds: Normal breath sounds.  Abdominal:     General: Abdomen is flat.      Palpations: Abdomen is soft.     Tenderness: There is abdominal tenderness (very mild) in the suprapubic area.  Musculoskeletal:        General: No tenderness.     Right lower leg: No edema.     Left lower leg: No edema.  Skin:    General: Skin is warm and dry.  Neurological:     General: No focal deficit present.     Mental Status: She is alert. Mental status is at baseline.  Psychiatric:        Mood and Affect: Mood normal.        Behavior: Behavior normal.     ED Results and Treatments Labs (all labs ordered are listed, but only abnormal results are displayed) Labs Reviewed  COMPREHENSIVE METABOLIC PANEL WITH GFR - Abnormal; Notable for the following components:      Result Value   Sodium 133 (*)    CO2 21 (*)    Glucose, Bld 102 (*)    Creatinine, Ser 0.39 (*)    AST 13 (*)    All other components within normal limits  CBC WITH DIFFERENTIAL/PLATELET - Abnormal; Notable for the following components:   RDW 16.5 (*)    All other components within normal limits  HCG, SERUM, QUALITATIVE - Abnormal; Notable for the following components:   Preg, Serum POSITIVE (*)    All other components within normal limits  HCG, QUANTITATIVE, PREGNANCY - Abnormal; Notable for the following components:   hCG, Beta Chain, Quant, S 55,505 (*)    All other components within normal limits  LIPASE, BLOOD  URINALYSIS, ROUTINE W REFLEX MICROSCOPIC  Radiology US  OB Transvaginal Result Date: 08/07/2023 CLINICAL DATA:  Pelvic cramping.  Pregnant EXAM: TRANSVAGINAL OB ULTRASOUND TECHNIQUE: Transvaginal ultrasound was performed for complete evaluation of the gestation as well as the maternal uterus, adnexal regions, and pelvic cul-de-sac. COMPARISON:  None Available. FINDINGS: Intrauterine gestational sac: Single Yolk sac:  Visualized. Embryo:  Visualized. Cardiac Activity: Visualized.  Heart Rate: 152 bpm CRL:   11.5 mm   7 w 2 d                  US  EDC: 03/23/2024 Subchorionic hemorrhage:  None visualized. Maternal uterus/adnexae: Uterus measures 9.5 x 4.9 by 7.3 cm. Trace free fluid in the pelvis. The right ovary measures 3.9 x 2.1 x 2.6 cm there is a complex cystic area in the right ovary measuring 2.4 cm. Blood flow to the right ovary on color Doppler. Left ovary has small follicles and measures 3.2 x 1.4 x 1.8 cm. Blood flow on color and spectral Doppler. IMPRESSION: Single live intrauterine pregnancy of 7 weeks and 2 days with positive fetal heart motion. Trace free fluid in the maternal pelvis. Complex right ovarian cystic lesion measuring 2.4 cm. Simple attention on follow-up. Electronically Signed   By: Ranell Bring M.D.   On: 08/07/2023 15:33    Pertinent labs & imaging results that were available during my care of the patient were reviewed by me and considered in my medical decision making (see MDM for details).  Medications Ordered in ED Medications - No data to display                                                                                                                                   Procedures Procedures  (including critical care time)  Medical Decision Making / ED Course   MDM:  28 year old presenting to the emergency department with abdominal cramping.  Patient pregnancy test is positive.  Differential includes possible early miscarriage, urinary infection, ectopic pregnancy.  Lower concern for other process such as appendicitis, TOA, minimal tenderness, no fevers.  Patient declines medication for pain or nausea reports currently she feels well.  Will proceed with ultrasound to evaluate for acute process.  Patient reports she has follow-up with OB/GYN.  Clinical Course as of 08/07/23 1614  Sat Aug 07, 2023  1609 US  shows IUP with FHT. Does have ovarian cyst and some trace fluid, possible ruptured cyst as cause. Not described as concerning for  heterotopic pregnancy per radiology and patient has not had IVF or anything else to raise concern for this. Patient still feeling well. She has follow up with OB Thursday. Recommended strict ER precautions for any new or worsening symptoms. Will discharge patient to home. All questions answered. Patient comfortable with plan of discharge. Return precautions discussed with patient and specified on the after visit summary.  [WS]    Clinical Course User Index [WS] Francesca Elsie CROME, MD     Additional  history obtained:  -External records from outside source obtained and reviewed including: Chart review including previous notes, labs, imaging, consultation notes including prior notes    Lab Tests: -I ordered, reviewed, and interpreted labs.   The pertinent results include:   Labs Reviewed  COMPREHENSIVE METABOLIC PANEL WITH GFR - Abnormal; Notable for the following components:      Result Value   Sodium 133 (*)    CO2 21 (*)    Glucose, Bld 102 (*)    Creatinine, Ser 0.39 (*)    AST 13 (*)    All other components within normal limits  CBC WITH DIFFERENTIAL/PLATELET - Abnormal; Notable for the following components:   RDW 16.5 (*)    All other components within normal limits  HCG, SERUM, QUALITATIVE - Abnormal; Notable for the following components:   Preg, Serum POSITIVE (*)    All other components within normal limits  HCG, QUANTITATIVE, PREGNANCY - Abnormal; Notable for the following components:   hCG, Beta Chain, Quant, S 55,505 (*)    All other components within normal limits  LIPASE, BLOOD  URINALYSIS, ROUTINE W REFLEX MICROSCOPIC    Notable for + pregnancy   Imaging Studies ordered: I ordered imaging studies including TV US   On my interpretation imaging demonstrates IUP I independently visualized and interpreted imaging. I agree with the radiologist interpretation   Medicines ordered and prescription drug management: No orders of the defined types were placed in this  encounter.   -I have reviewed the patients home medicines and have made adjustments as needed    Reevaluation: After the interventions noted above, I reevaluated the patient and found that their symptoms have improved  Co morbidities that complicate the patient evaluation  Past Medical History:  Diagnosis Date   Asthma    Recurrent upper respiratory infection (URI)    Seasonal allergies       Dispostion: Disposition decision including need for hospitalization was considered, and patient discharged from emergency department.    Final Clinical Impression(s) / ED Diagnoses Final diagnoses:  Normal IUP (intrauterine pregnancy) on prenatal ultrasound, first trimester     This chart was dictated using voice recognition software.  Despite best efforts to proofread,  errors can occur which can change the documentation meaning.    Francesca Elsie CROME, MD 08/07/23 [phone]

## 2023-08-09 ENCOUNTER — Other Ambulatory Visit (HOSPITAL_COMMUNITY)
Admission: RE | Admit: 2023-08-09 | Discharge: 2023-08-09 | Disposition: A | Discharge status: Home / Self Care | Source: Ambulatory Visit | Attending: Obstetrics & Gynecology | Admitting: Obstetrics & Gynecology

## 2023-08-09 ENCOUNTER — Ambulatory Visit

## 2023-08-09 VITALS — BP 100/70 | HR 82 | Ht 65.0 in | Wt 143.0 lb

## 2023-08-09 DIAGNOSIS — Z3480 Encounter for supervision of other normal pregnancy, unspecified trimester: Secondary | ICD-10-CM

## 2023-08-09 NOTE — Progress Notes (Signed)
 Pt here for nurse visit and labs. Patient with recent ED visit with confirmed IUP on 08/07/2023. Pt reports mild cramps this morning which has resolved with no bleeding. Reviewed with Dr. Cris with verbal orders to complete new OB labs and screening today and follow up as scheduled for new OB appointment. Reviewed when to go to [facility]. Patient given safe otc medications during pregnancy list and lab requisitions to have completed at [facility]. Patient verbalized understanding.  Silvano LELON Piano, RN

## 2023-08-10 LAB — CBC/D/PLT+RPR+RH+ABO+RUBIGG...
Antibody Screen: NEGATIVE
Basophils Absolute: 0.1 x10E3/uL (ref 0.0–0.2)
Basos: 1 %
EOS (ABSOLUTE): 0.3 x10E3/uL (ref 0.0–0.4)
Eos: 5 %
HCV Ab: NONREACTIVE
HIV Screen 4th Generation wRfx: NONREACTIVE
Hematocrit: 39.5 % (ref 34.0–46.6)
Hemoglobin: 12.4 g/dL (ref 11.1–15.9)
Hepatitis B Surface Ag: NEGATIVE
Immature Grans (Abs): 0 x10E3/uL (ref 0.0–0.1)
Immature Granulocytes: 0 %
Lymphocytes Absolute: 2.8 x10E3/uL (ref 0.7–3.1)
Lymphs: 42 %
MCH: 27.4 pg (ref 26.6–33.0)
MCHC: 31.4 g/dL — ABNORMAL LOW (ref 31.5–35.7)
MCV: 87 fL (ref 79–97)
Monocytes Absolute: 0.6 x10E3/uL (ref 0.1–0.9)
Monocytes: 9 %
Neutrophils Absolute: 2.9 x10E3/uL (ref 1.4–7.0)
Neutrophils: 43 %
Platelets: 367 x10E3/uL (ref 150–450)
RBC: 4.52 x10E6/uL (ref 3.77–5.28)
RDW: 15.9 % — ABNORMAL HIGH (ref 11.7–15.4)
RPR Ser Ql: NONREACTIVE
Rh Factor: POSITIVE
Rubella Antibodies, IGG: 28.5 {index} (ref >0.99)
WBC: 6.7 x10E3/uL (ref 3.4–10.8)

## 2023-08-10 LAB — CERVICOVAGINAL ANCILLARY ONLY
Chlamydia: NEGATIVE
Comment: NEGATIVE
Comment: NORMAL
Neisseria Gonorrhea: NEGATIVE

## 2023-08-10 LAB — HCV INTERPRETATION

## 2023-08-10 LAB — HEMOGLOBIN A1C
Est. average glucose Bld gHb Est-mCnc: 117 mg/dL
Hgb A1c MFr Bld: 5.7 % — ABNORMAL HIGH (ref 4.8–5.6)

## 2023-08-12 ENCOUNTER — Ambulatory Visit: Admitting: Obstetrics and Gynecology

## 2023-08-12 ENCOUNTER — Encounter: Payer: Self-pay | Admitting: Obstetrics and Gynecology

## 2023-08-12 VITALS — BP 103/73 | HR 93 | Wt 142.0 lb

## 2023-08-12 DIAGNOSIS — Z1331 Encounter for screening for depression: Secondary | ICD-10-CM | POA: Diagnosis not present

## 2023-08-12 DIAGNOSIS — Z3481 Encounter for supervision of other normal pregnancy, first trimester: Secondary | ICD-10-CM

## 2023-08-12 DIAGNOSIS — O09899 Supervision of other high risk pregnancies, unspecified trimester: Secondary | ICD-10-CM | POA: Insufficient documentation

## 2023-08-12 DIAGNOSIS — J4520 Mild intermittent asthma, uncomplicated: Secondary | ICD-10-CM

## 2023-08-12 DIAGNOSIS — Z3480 Encounter for supervision of other normal pregnancy, unspecified trimester: Secondary | ICD-10-CM | POA: Insufficient documentation

## 2023-08-12 DIAGNOSIS — Z3A08 8 weeks gestation of pregnancy: Secondary | ICD-10-CM

## 2023-08-12 DIAGNOSIS — R7303 Prediabetes: Secondary | ICD-10-CM | POA: Diagnosis not present

## 2023-08-12 NOTE — Patient Instructions (Signed)
[email or web address] Considering Waterbirth? Guide for patients at [facility] for [facility]) Why consider waterbirth? Gentle birth for babies  Less pain medicine used in labor  May allow for passive descent/less pushing  May reduce perineal tears  More mobility and instinctive maternal position changes  Increased maternal relaxation   Is waterbirth safe? What are the risks of infection, drowning or other complications? Infection:  Very low risk (3.7 % for tub vs 4.8% for bed)  7 in 8000 waterbirths with documented infection  Poorly cleaned equipment most common cause  Slightly lower group B strep transmission rate  Drowning  Maternal:  Very low risk  Related to seizures or fainting  Newborn:  Very low risk. No evidence of increased risk of respiratory problems in multiple large studies  Physiological protection from breathing under water  Avoid underwater birth if there are any fetal complications  Once baby's head is out of the water, keep it out.  Birth complication  Some reports of cord trauma, but risk decreased by bringing baby to surface gradually  No evidence of increased risk of shoulder dystocia. Mothers can usually change positions faster in water than in a bed, possibly aiding the maneuvers to free the shoulder.   There are 2 things you MUST do to have a waterbirth with [facility]: Attend a waterbirth class at [facility]   3rd Wednesday of every month from 7-9 pm (virtual during COVID) [employer] at [email or web address] or [email or web address] or by calling [phone] Bring [place] the certificate from the class to your prenatal appointment or send via MyChart Meet with a midwife at 36 weeks* to see if you can still plan a waterbirth and to sign the consent.   *We also recommend that you schedule as many of your prenatal visits with a midwife as possible.    Helpful information: You may want to bring a bathing suit  top to the hospital to wear during labor but this is optional.  All other supplies are provided by the hospital. Please arrive at the hospital with signs of active labor, and do not wait at home until late in labor. It takes 45 min- 1 hour for fetal monitoring, and check in to your room to take place, plus transport and filling of the waterbirth tub.    Things that would prevent you from having a waterbirth: Premature, <37wks  Previous cesarean birth  Presence of thick meconium-stained fluid  Multiple gestation (Twins, triplets, etc.)  Uncontrolled diabetes or gestational diabetes requiring medication  Hypertension diagnosed in pregnancy or preexisting hypertension (gestational hypertension, preeclampsia, or chronic hypertension) Fetal growth restriction (your baby measures less than 10th percentile on ultrasound) Heavy vaginal bleeding  Non-reassuring fetal heart rate  Active infection (MRSA, etc.). Group B Strep is NOT a contraindication for waterbirth.  If your labor has to be induced and induction method requires continuous monitoring of the baby's heart rate  Other risks/issues identified by your obstetrical provider   Please remember that birth is unpredictable. Under certain unforeseeable circumstances your provider may advise against giving birth in the tub. These decisions will be made on a case-by-case basis and with the safety of you and your baby as our highest priority.    Updated 04/23/21

## 2023-08-12 NOTE — Progress Notes (Signed)
 Subjective:   Lesa Mullin is a 28 y.o. G2P0010 at [redacted]w[redacted]d by LMP c/w 7wk US  being seen today for her first obstetrical visit.  Her obstetrical history is significant for none. Patient does intend to breast feed. Pregnancy history fully reviewed.  Patient reports doing well overall.  HISTORY: OB History  Gravida Para Term Preterm AB Living  2 0 0 0 1 0  SAB IAB Ectopic Multiple Live Births  0 1 0 0 0    # Outcome Date GA Lbr Len/2nd Weight Sex Type Anes PTL Lv  2 Current           1 IAB 2019 [redacted]w[redacted]d            Last pap smear: NILM 10/29/21  Past Medical History:  Diagnosis Date   Asthma    Recurrent upper respiratory infection (URI)    Seasonal allergies    No past surgical history on file. Family History  Problem Relation Age of Onset   Stroke Father    Epilepsy Sister    Asthma Sister    Epilepsy Brother    Allergic rhinitis Neg Hx    Angioedema Neg Hx    Eczema Neg Hx    Urticaria Neg Hx    Social History   Tobacco Use   Smoking status: Former    Types: Cigarettes   Smokeless tobacco: Never  Vaping Use   Vaping status: Never Used  Substance Use Topics   Alcohol use: Not Currently   Drug use: Not Currently   Allergies  Allergen Reactions   Mushroom Extract Complex (Obsolete) Itching, Shortness Of Breath and Swelling   Other Hives, Shortness Of Breath and Swelling   Current Outpatient Medications on File Prior to Visit  Medication Sig Dispense Refill   albuterol (VENTOLIN HFA) 108 (90 Base) MCG/ACT inhaler Inhale 2 puffs into the lungs every 6 (six) hours as needed for wheezing or shortness of breath. (Patient not taking: Reported on 08/09/2023)     No current facility-administered medications on file prior to visit.     Exam   Vitals:   08/12/23 1344  BP: 103/73  Pulse: 93  Weight: 142 lb (64.4 kg)      General:  Alert, oriented and cooperative. Patient is in no acute distress.  Breast: Deferred  Cardiovascular: Normal heart rate  noted  Respiratory: Normal respiratory effort, no problems with respiration noted  Abdomen: Soft, non tender  Bedside ultrasound confirms SLIUP today  Assessment:   Pregnancy: G2P0010 Patient Active Problem List   Diagnosis Date Noted   Supervision of other normal pregnancy, antepartum 08/12/2023   Prediabetes 08/12/2023   Mild intermittent asthma without complication 08/12/2023     Plan:  1. Supervision of other normal pregnancy, antepartum (Primary) 2. [redacted] weeks gestation of pregnancy Labs reviewed and normal w/ exception of A1c (see below) Initial labs drawn. Continue prenatal vitamins. Genetic Screening discussed: NIPS, carrier screening and AFP, ordered. Ultrasound discussed; fetal anatomic survey: ordered. Problem list reviewed and updated. Interested in Heritage manager given, will schedule with CNMs The nature of [place] [facility] with multiple MDs and other Advanced Practice Providers was explained to patient; also emphasized that residents, students are part of our team. Routine obstetric precautions reviewed. - PANORAMA PRENATAL TEST - HORIZON Basic Panel - US  MFM OB COMP + 14 WK; Future  3. Prediabetes A1c 5.7 Discussed potential for ldASA esp if family history of preeclampsia (sister & mom have HTN) -  Glucose Tolerance, 2 Hours w/1 Hour; Future  4. Mild intermittent asthma without complication Albuterol prn Previously on symbicort  but hasn't been using and hasn't had issues. Rarely using albuterol  Return in about 4 weeks (around 09/09/2023) for return OB at 12 weeks with 2h GTT - CNM preferred.  Kieth Carolin, MD Obstetrician & Gynecologist, [facility], [facility]

## 2023-08-13 ENCOUNTER — Ambulatory Visit: Payer: Self-pay | Admitting: Obstetrics & Gynecology

## 2023-08-29 LAB — PANORAMA PRENATAL TEST FULL PANEL:PANORAMA TEST PLUS 5 ADDITIONAL MICRODELETIONS: FETAL FRACTION: 7.5

## 2023-08-31 LAB — HORIZON CUSTOM: REPORT SUMMARY: NEGATIVE

## 2023-09-01 ENCOUNTER — Ambulatory Visit: Payer: Self-pay | Admitting: Obstetrics and Gynecology

## 2023-09-01 DIAGNOSIS — Z3480 Encounter for supervision of other normal pregnancy, unspecified trimester: Secondary | ICD-10-CM

## 2023-09-03 ENCOUNTER — Ambulatory Visit (INDEPENDENT_AMBULATORY_CARE_PROVIDER_SITE_OTHER): Admitting: Obstetrics and Gynecology

## 2023-09-03 ENCOUNTER — Other Ambulatory Visit

## 2023-09-03 VITALS — BP 104/73 | HR 76 | Wt 143.0 lb

## 2023-09-03 DIAGNOSIS — R7303 Prediabetes: Secondary | ICD-10-CM | POA: Diagnosis not present

## 2023-09-03 DIAGNOSIS — Z3480 Encounter for supervision of other normal pregnancy, unspecified trimester: Secondary | ICD-10-CM | POA: Diagnosis not present

## 2023-09-03 NOTE — Progress Notes (Signed)
   PRENATAL VISIT NOTE  Subjective:  Joyce Howard is a 28 y.o. G2P0010 at [redacted]w[redacted]d being seen today for ongoing prenatal care.  She is currently monitored for the following issues for this low-risk pregnancy and has Supervision of other normal pregnancy, antepartum; Prediabetes; and Mild intermittent asthma without complication on their problem list.  Patient reports no complaints.  Contractions: Not present. Vag. Bleeding: None.  Movement: Present. Denies leaking of fluid.   The following portions of the patient's history were reviewed and updated as appropriate: allergies, current medications, past family history, past medical history, past social history, past surgical history and problem list.   Objective:    Vitals:   09/03/23 0827  BP: 104/73  Pulse: 76  Weight: 143 lb (64.9 kg)    Fetal Status:      Movement: Present    General: Alert, oriented and cooperative. Patient is in no acute distress.  Skin: Skin is warm and dry. No rash noted.   Cardiovascular: Normal heart rate noted  Respiratory: Normal respiratory effort, no problems with respiration noted  Abdomen: Soft, gravid, appropriate for gestational age.  Pain/Pressure: Absent     Pelvic: Cervical exam deferred        Extremities: Normal range of motion.  Edema: None  Mental Status: Normal mood and affect. Normal behavior. Normal judgment and thought content.   Assessment and Plan:  Pregnancy: G2P0010 at [redacted]w[redacted]d  1. Prediabetes (Primary)  - Glucose Tolerance, 2 Hours w/1 Hour -If normal she is aware she will need to repeat at 28w  2. Supervision of other normal pregnancy, antepartum  -RN unable to doppler fetal heart tones.   Pt informed that the ultrasound is considered a limited OB ultrasound and is not intended to be a complete ultrasound exam.  Patient also informed that the ultrasound is not being completed with the intent of assessing for fetal or placental anomalies or any pelvic abnormalities.  Explained  that the purpose of today's ultrasound is to assess for  viability.  Patient acknowledges the purpose of the exam and the limitations of the study.   Active fetus noted.   Start BASA- AA, G1, prediabetes.   Preterm labor symptoms and general obstetric precautions including but not limited to vaginal bleeding, contractions, leaking of fluid and fetal movement were reviewed in detail with the patient. Please refer to After Visit Summary for other counseling recommendations.   No follow-ups on file.  Future Appointments  Date Time Provider Department Center  09/21/2023  2:50 PM [place] Natarajan, Delon FERNS, NP [facility]    Delon Emms, NP

## 2023-09-04 LAB — GLUCOSE TOLERANCE, 2 HOURS W/ 1HR
Glucose, 1 hour: 126 mg/dL (ref 70–179)
Glucose, Fasting: 83 mg/dL (ref 70–91)

## 2023-09-13 ENCOUNTER — Inpatient Hospital Stay (HOSPITAL_COMMUNITY)
Admission: AD | Admit: 2023-09-13 | Discharge: 2023-09-13 | Disposition: A | Discharge status: Home / Self Care | Attending: Obstetrics and Gynecology | Admitting: Obstetrics and Gynecology

## 2023-09-13 ENCOUNTER — Encounter (HOSPITAL_COMMUNITY): Payer: Self-pay | Admitting: Obstetrics and Gynecology

## 2023-09-13 DIAGNOSIS — Z3A13 13 weeks gestation of pregnancy: Secondary | ICD-10-CM | POA: Diagnosis not present

## 2023-09-13 DIAGNOSIS — O26891 Other specified pregnancy related conditions, first trimester: Secondary | ICD-10-CM | POA: Insufficient documentation

## 2023-09-13 DIAGNOSIS — R519 Headache, unspecified: Secondary | ICD-10-CM | POA: Insufficient documentation

## 2023-09-13 DIAGNOSIS — R110 Nausea: Secondary | ICD-10-CM | POA: Diagnosis not present

## 2023-09-13 DIAGNOSIS — O209 Hemorrhage in early pregnancy, unspecified: Secondary | ICD-10-CM | POA: Insufficient documentation

## 2023-09-13 DIAGNOSIS — Z7982 Long term (current) use of aspirin: Secondary | ICD-10-CM | POA: Diagnosis not present

## 2023-09-13 LAB — CBC
HCT: 34.1 % — ABNORMAL LOW (ref 36.0–46.0)
Hemoglobin: 10.9 g/dL — ABNORMAL LOW (ref 12.0–15.0)
MCH: 26.8 pg (ref 26.0–34.0)
MCHC: 32 g/dL (ref 30.0–36.0)
MCV: 84 fL (ref 80.0–100.0)
Platelets: 330 K/uL (ref 150–400)
RBC: 4.06 MIL/uL (ref 3.87–5.11)
RDW: 16.7 % — ABNORMAL HIGH (ref 11.5–15.5)
WBC: 8.2 K/uL (ref 4.0–10.5)
nRBC: 0 % (ref 0.0–0.2)

## 2023-09-13 MED ORDER — ONDANSETRON 4 MG PO TBDP: 4.0000 mg | ORAL_TABLET | Freq: Three times a day (TID) | ORAL | 0 refills | Status: DC | PRN

## 2023-09-13 MED ORDER — CYCLOBENZAPRINE HCL 5 MG PO TABS: 5.0000 mg | ORAL_TABLET | Freq: Three times a day (TID) | ORAL | 0 refills | Status: AC | PRN

## 2023-09-13 NOTE — MAU Provider Note (Signed)
 Chief Complaint: Vaginal Bleeding   Event Date/Time   First Provider Initiated Contact with Patient 09/13/23 1807     SUBJECTIVE HPI: Joyce Howard is a 28 y.o. G2P0010 at [redacted]w[redacted]d who presents to [facility] Admissions reporting vaginal bleeding while using a bathroom around 4.30pm today. C/o lightheaded and dizziness during the episode. Her is currently taking ASA 81 mg for preeclampsia prophylaxis. Pt denies dizziness, weakness, fever, heavy vaginal bleeding, vaginal discharge. No additional bleeding episode after the initial event.       Past Medical History:  Diagnosis Date   Asthma    Recurrent upper respiratory infection (URI)    Seasonal allergies    OB History  Gravida Para Term Preterm AB Living  2    1   SAB IAB Ectopic Multiple Live Births   1       # Outcome Date GA Lbr Len/2nd Weight Sex Type Anes PTL Lv  2 Current           1 IAB 2019 [redacted]w[redacted]d          Past Surgical History:  Procedure Laterality Date   DILATION AND CURETTAGE OF UTERUS     Social History   Socioeconomic History   Marital status: Single    Spouse name: Not on file   Number of children: Not on file   Years of education: Not on file   Highest education level: Not on file  Occupational History   Occupation: [employer]  Tobacco Use   Smoking status: Former    Types: Cigarettes   Smokeless tobacco: Never  Vaping Use   Vaping status: Never Used  Substance and Sexual Activity   Alcohol use: Not Currently   Drug use: Not Currently   Sexual activity: Yes    Birth control/protection: None  Other Topics Concern   Not on file  Social History Narrative   Not on file   Social Drivers of Health   Financial Resource Strain: Not on file  Food Insecurity: Not on file  Transportation Needs: Not on file  Physical Activity: Not on file  Stress: Not on file  Social Connections: Not on file  Intimate Partner Violence: Not on file   Family History  Problem Relation Age of Onset   Stroke Father     Epilepsy Sister    Asthma Sister    Epilepsy Brother    Allergic rhinitis Neg Hx    Angioedema Neg Hx    Eczema Neg Hx    Urticaria Neg Hx    No current facility-administered medications on file prior to encounter.   Current Outpatient Medications on File Prior to Encounter  Medication Sig Dispense Refill   aspirin EC 81 MG tablet Take 81 mg by mouth daily. Swallow whole.     Prenatal Vit-Fe Fumarate-FA (MULTIVITAMIN-PRENATAL) 27-0.8 MG TABS tablet Take 1 tablet by mouth daily at 12 noon.     albuterol (VENTOLIN HFA) 108 (90 Base) MCG/ACT inhaler Inhale 2 puffs into the lungs every 6 (six) hours as needed for wheezing or shortness of breath.     Allergies  Allergen Reactions   Mushroom Extract Complex (Obsolete) Itching, Shortness Of Breath and Swelling   Other Hives, Shortness Of Breath and Swelling    I have reviewed patient's Past Medical Hx, Surgical Hx, Family Hx, Social Hx, medications and allergies.   Review of Systems  Respiratory: Negative.    Cardiovascular: Negative.   Gastrointestinal: Negative.   Genitourinary:  Positive for pelvic pain.  OBJECTIVE Patient Vitals for the past 24 hrs:  BP Temp Temp src Pulse Resp SpO2 Height Weight  09/13/23 1902 98/70 -- -- 72 16 100 % -- --  09/13/23 1739 105/69 98.4 F (36.9 C) Oral 87 16 100 % 5' 5 (1.651 m) 64.2 kg   Constitutional: Well-developed, well-nourished female in no acute distress.  Cardiovascular: normal rate & rhythm, no murmur Respiratory: normal rate and effort. Lung sounds clear throughout GI: Abd soft, non-tender, Pos BS x 4. No guarding or rebound tenderness MS: Extremities nontender, no edema, normal ROM Neurologic: Alert and oriented x 4.   Bedside Transabdominal US  show fetus appropriately located in uterus with present of heart beats   LAB RESULTS Results for orders placed or performed during the hospital encounter of 09/13/23 (from the past 24 hours)  CBC     Status: Abnormal   Collection  Time: 09/13/23  5:58 PM  Result Value Ref Range   WBC 8.2 4.0 - 10.5 K/uL   RBC 4.06 3.87 - 5.11 MIL/uL   Hemoglobin 10.9 (L) 12.0 - 15.0 g/dL   HCT 65.8 (L) 63.9 - 53.9 %   MCV 84.0 80.0 - 100.0 fL   MCH 26.8 26.0 - 34.0 pg   MCHC 32.0 30.0 - 36.0 g/dL   RDW 83.2 (H) 88.4 - 84.4 %   Platelets 330 150 - 400 K/uL   nRBC 0.0 0.0 - 0.2 %    IMAGING No results found.  MAU COURSE Orders Placed This Encounter  Procedures         Meds ordered this encounter  Medications   ondansetron  (ZOFRAN -ODT) 4 MG disintegrating tablet    Sig: Take 1 tablet (4 mg total) by mouth every 8 (eight) hours as needed for nausea or vomiting.    Dispense:  20 tablet    Refill:  0   cyclobenzaprine  (FLEXERIL ) 5 MG tablet    Sig: Take 1 tablet (5 mg total) by mouth 3 (three) times daily as needed for muscle spasms.    Dispense:  30 tablet    Refill:  0    MDM - Bedside US   ASSESSMENT/PLAN 1. Nonintractable headache, unspecified chronicity pattern, unspecified headache type   2. Vaginal bleeding before [redacted] weeks gestation   3. Nausea   4. [redacted] weeks gestation of pregnancy     1. Vaginal bleeding before [redacted] weeks gestation(Primary) Normal FHR, normal Transabdominal US   - Will continue to monitor  - Follow up w/ OBGYN as scheduled - Discharged home for self care   2. Nonintractable headache, unspecified chronicity pattern, unspecified headache type  - Starting Flexeril  5 mg TID PRN   3. Nausea - Starting Zofran  4 mg Q8H PRN   4. [redacted] weeks gestation of pregnancy    Allergies as of 09/13/2023       Reactions   Mushroom Extract Complex (obsolete) Itching, Shortness Of Breath, Swelling   Other Hives, Shortness Of Breath, Swelling        Medication List     TAKE these medications    albuterol 108 (90 Base) MCG/ACT inhaler Commonly known as: VENTOLIN HFA Inhale 2 puffs into the lungs every 6 (six) hours as needed for wheezing or shortness of breath.   aspirin EC 81 MG tablet Take 81  mg by mouth daily. Swallow whole.   cyclobenzaprine  5 MG tablet Commonly known as: FLEXERIL  Take 1 tablet (5 mg total) by mouth 3 (three) times daily as needed for muscle spasms.   multivitamin-prenatal 27-0.8 MG  Tabs tablet Take 1 tablet by mouth daily at 12 noon.   ondansetron  4 MG disintegrating tablet Commonly known as: ZOFRAN -ODT Take 1 tablet (4 mg total) by mouth every 8 (eight) hours as needed for nausea or vomiting.         Suzen Houston NOVAK, DO PGY 1 Family Medicine Resident 09/13/2023 7:24 PM

## 2023-09-13 NOTE — MAU Note (Signed)
 Joyce Howard is a 28 y.o. at [redacted]w[redacted]d here in MAU reporting: started bleeding 'out of nowhere' about ago.  Denies fall, abd trauma or recent intercourse.  No prior bleeding in preg. Denies any pain. Blood was a little in clothes, mostly in toilet. States is feeling a little pressure in her stomach   Onset of complaint: ~1700 Pain score: none Vitals:   09/13/23 1739  BP: 105/69  Pulse: 87  Resp: 16  Temp: 98.4 F (36.9 C)  SpO2: 100%      Lab orders placed from triage:    No issues on first US 

## 2023-09-14 ENCOUNTER — Telehealth: Payer: Self-pay | Admitting: *Deleted

## 2023-09-14 ENCOUNTER — Telehealth: Payer: Self-pay

## 2023-09-14 NOTE — Telephone Encounter (Signed)
 RN received message to call patient regarding results. RN spoke with patient who had questions about her labs that were drawn in MAU. RN reported would reach out to provider to interpret results. Pt reported feeling better, bleeding less, not needing to wear a pad. RN reported would reach out to provider regarding labs and follow up.  Silvano LELON Piano, RN

## 2023-09-14 NOTE — Telephone Encounter (Signed)
 Left a message for someone to call her to explain test results.

## 2023-09-14 NOTE — Telephone Encounter (Signed)
 Patient wanted to let Delon Emms know that she went to MAU yesterday. She is still bleeding but not in pain.

## 2023-09-15 ENCOUNTER — Encounter: Payer: Self-pay | Admitting: Obstetrics and Gynecology

## 2023-09-16 ENCOUNTER — Telehealth: Payer: Self-pay | Admitting: *Deleted

## 2023-09-16 NOTE — Telephone Encounter (Signed)
 Patient left a message that she thought that the provider wanted her to be seen prior to 09/21/2023. I do not see any communication that states that. Please reach out to patient for update.

## 2023-09-21 ENCOUNTER — Ambulatory Visit (INDEPENDENT_AMBULATORY_CARE_PROVIDER_SITE_OTHER): Admitting: Obstetrics and Gynecology

## 2023-09-21 ENCOUNTER — Other Ambulatory Visit (HOSPITAL_COMMUNITY)
Admission: RE | Admit: 2023-09-21 | Discharge: 2023-09-21 | Disposition: A | Discharge status: Home / Self Care | Source: Ambulatory Visit | Attending: Obstetrics and Gynecology | Admitting: Obstetrics and Gynecology

## 2023-09-21 ENCOUNTER — Other Ambulatory Visit

## 2023-09-21 VITALS — BP 106/71 | HR 88 | Wt 139.0 lb

## 2023-09-21 DIAGNOSIS — R7303 Prediabetes: Secondary | ICD-10-CM | POA: Diagnosis not present

## 2023-09-21 DIAGNOSIS — O4690 Antepartum hemorrhage, unspecified, unspecified trimester: Secondary | ICD-10-CM | POA: Insufficient documentation

## 2023-09-21 DIAGNOSIS — O4692 Antepartum hemorrhage, unspecified, second trimester: Secondary | ICD-10-CM | POA: Diagnosis not present

## 2023-09-21 DIAGNOSIS — Z3480 Encounter for supervision of other normal pregnancy, unspecified trimester: Secondary | ICD-10-CM

## 2023-09-21 DIAGNOSIS — Z3A14 14 weeks gestation of pregnancy: Secondary | ICD-10-CM

## 2023-09-21 NOTE — Progress Notes (Signed)
   PRENATAL VISIT NOTE  Subjective:  Joyce Howard is a 28 y.o. G2P0010 at [redacted]w[redacted]d being seen today for ongoing prenatal care.  She is currently monitored for the following issues for this low-risk pregnancy and has Supervision of other normal pregnancy, antepartum; Prediabetes; Mild intermittent asthma without complication; and Vaginal bleeding in pregnancy on their problem list.  Patient reports continue vaginal bleeding, she was seen in MAU on 09/13/2023.  Contractions: Not present. Vag. Bleeding: Scant (dark red spotting this morning).  Movement: Absent. Denies leaking of fluid.   The following portions of the patient's history were reviewed and updated as appropriate: allergies, current medications, past family history, past medical history, past social history, past surgical history and problem list.   Objective:    Vitals:   09/21/23 1449  BP: 106/71  Pulse: 88  Weight: 139 lb (63 kg)    Fetal Status:  Fetal Heart Rate (bpm): 161   Movement: Absent    General: Alert, oriented and cooperative. Patient is in no acute distress.  Skin: Skin is warm and dry. No rash noted.   Cardiovascular: Normal heart rate noted  Respiratory: Normal respiratory effort, no problems with respiration noted  Abdomen: Soft, gravid, appropriate for gestational age.  Pain/Pressure: Absent     Pelvic: Cervical exam deferred       Wet prep collected   Extremities: Normal range of motion.  Edema: None  Mental Status: Normal mood and affect. Normal behavior. Normal judgment and thought content.   Assessment and Plan:  Pregnancy: G2P0010 at [redacted]w[redacted]d   1. Vaginal bleeding in pregnancy (Primary)  Was seen in MAU on 8/25 with stable bedside US  Had sex on Sunday and bleeding started- dark brown in color. No pain Wet prep and GC collected today; dark brown colored blood noted on swabs 0/10 pain Recommend pelvic rest. If pain or worsening bleeding go back to MAU.  + fetal heart tones today.   2.  Supervision of other normal pregnancy, antepartum  Doing well overall.   3. Prediabetes  A1C 5.7% Unable to complete early 2 hour GTT on 8/15 Completed 2 hour GTT today. Results pending    Preterm labor symptoms and general obstetric precautions including but not limited to vaginal bleeding, contractions, leaking of fluid and fetal movement were reviewed in detail with the patient. Please refer to After Visit Summary for other counseling recommendations.   No follow-ups on file.  No future appointments.  Delon Emms, NP

## 2023-09-21 NOTE — Progress Notes (Signed)
 Pt here for repeat lab Gtt, per chart review unable to complete first early Gtt, made it through 1 hour, vomited before could draw 2 hour. Lab requisition given to patient to complete at [facility]. Pt to follow this afternoon with provider.  Silvano LELON Piano, RN

## 2023-09-22 LAB — CERVICOVAGINAL ANCILLARY ONLY
Bacterial Vaginitis (gardnerella): NEGATIVE
Candida Glabrata: NEGATIVE
Candida Vaginitis: NEGATIVE
Chlamydia: NEGATIVE
Comment: NEGATIVE
Comment: NEGATIVE
Comment: NEGATIVE
Comment: NEGATIVE
Comment: NEGATIVE
Comment: NORMAL
Neisseria Gonorrhea: NEGATIVE
Trichomonas: NEGATIVE

## 2023-09-22 LAB — GLUCOSE TOLERANCE, 2 HOURS W/ 1HR
Glucose, 1 hour: 130 mg/dL (ref 70–179)
Glucose, 2 hour: 113 mg/dL (ref 70–152)
Glucose, Fasting: 79 mg/dL (ref 70–91)

## 2023-09-23 ENCOUNTER — Ambulatory Visit: Payer: Self-pay | Admitting: Obstetrics and Gynecology

## 2023-10-19 ENCOUNTER — Ambulatory Visit (INDEPENDENT_AMBULATORY_CARE_PROVIDER_SITE_OTHER): Admitting: Obstetrics and Gynecology

## 2023-10-19 VITALS — BP 106/71 | HR 91 | Wt 141.0 lb

## 2023-10-19 DIAGNOSIS — R7303 Prediabetes: Secondary | ICD-10-CM | POA: Diagnosis not present

## 2023-10-19 DIAGNOSIS — R350 Frequency of micturition: Secondary | ICD-10-CM

## 2023-10-19 DIAGNOSIS — Z3A18 18 weeks gestation of pregnancy: Secondary | ICD-10-CM

## 2023-10-19 DIAGNOSIS — Z3480 Encounter for supervision of other normal pregnancy, unspecified trimester: Secondary | ICD-10-CM

## 2023-10-19 NOTE — Addendum Note (Signed)
 Addended by: ORLINDA SILVANO ORN on: 10/19/2023 11:12 AM   Modules accepted: Orders

## 2023-10-19 NOTE — Progress Notes (Signed)
   PRENATAL VISIT NOTE  Subjective:  Joyce Howard is a 28 y.o. G2P0010 at [redacted]w[redacted]d being seen today for ongoing prenatal care.  She is currently monitored for the following issues for this low-risk pregnancy and has Supervision of other normal pregnancy, antepartum; Prediabetes; Mild intermittent asthma without complication; and Vaginal bleeding in pregnancy on their problem list.  Patient reports no complaints.  Contractions: Not present. Vag. Bleeding: None.  Movement: Present. Denies leaking of fluid.   The following portions of the patient's history were reviewed and updated as appropriate: allergies, current medications, past family history, past medical history, past social history, past surgical history and problem list.   Objective:    Vitals:   10/19/23 1033  BP: 106/71  Pulse: 91  Weight: 141 lb (64 kg)    Fetal Status:  Fetal Heart Rate (bpm): 156   Movement: Present    General: Alert, oriented and cooperative. Patient is in no acute distress.  Skin: Skin is warm and dry. No rash noted.   Cardiovascular: Normal heart rate noted  Respiratory: Normal respiratory effort, no problems with respiration noted  Abdomen: Soft, gravid, appropriate for gestational age.  Pain/Pressure: Present     Pelvic: Cervical exam deferred        Extremities: Normal range of motion.  Edema: None  Mental Status: Normal mood and affect. Normal behavior. Normal judgment and thought content.   Assessment and Plan:  Pregnancy: G2P0010 at [redacted]w[redacted]d  1. Supervision of other normal pregnancy, antepartum (Primary) Doing well Recently rescued a kitten- discussed not changing the liter box Interested in AFP today.  MFM US  scheduled Reports some round ligament pain that comes and goes in her lower pelvis. Discussed warning signs Will collect UA/culture to r/o UTI  2. Prediabetes  Early 2 hour GTT normal- will repeat at 28w   Preterm labor symptoms and general obstetric precautions including but  not limited to vaginal bleeding, contractions, leaking of fluid and fetal movement were reviewed in detail with the patient. Please refer to After Visit Summary for other counseling recommendations.   No follow-ups on file.  Future Appointments  Date Time Provider Department Center  11/02/2023  9:00 AM [facility] PROVIDER 1 [facility]  11/02/2023  9:30 AM [facility] US2 [facility]    Delon Emms, NP

## 2023-10-21 LAB — AFP, SERUM, OPEN SPINA BIFIDA
AFP MoM: 0.85
AFP Value: 41.7 ng/mL
Gest. Age on Collection Date: 18 wk
Maternal Age At EDD: 26.6 a
OSBR Risk 1 IN: 10000
Test Results:: NEGATIVE
Weight: 141 [lb_av]

## 2023-10-21 LAB — URINE CULTURE, OB REFLEX: Organism ID, Bacteria: NO GROWTH

## 2023-10-21 LAB — CULTURE, OB URINE

## 2023-11-02 ENCOUNTER — Ambulatory Visit: Attending: Obstetrics and Gynecology

## 2023-11-02 ENCOUNTER — Ambulatory Visit

## 2023-11-02 ENCOUNTER — Ambulatory Visit (HOSPITAL_BASED_OUTPATIENT_CLINIC_OR_DEPARTMENT_OTHER): Admitting: Maternal & Fetal Medicine

## 2023-11-02 ENCOUNTER — Other Ambulatory Visit: Payer: Self-pay | Admitting: *Deleted

## 2023-11-02 ENCOUNTER — Other Ambulatory Visit: Payer: Self-pay | Admitting: Obstetrics and Gynecology

## 2023-11-02 ENCOUNTER — Ambulatory Visit: Payer: Self-pay | Admitting: Obstetrics and Gynecology

## 2023-11-02 VITALS — BP 98/65

## 2023-11-02 DIAGNOSIS — Z363 Encounter for antenatal screening for malformations: Secondary | ICD-10-CM | POA: Diagnosis not present

## 2023-11-02 DIAGNOSIS — O09899 Supervision of other high risk pregnancies, unspecified trimester: Secondary | ICD-10-CM

## 2023-11-02 DIAGNOSIS — Z3A20 20 weeks gestation of pregnancy: Secondary | ICD-10-CM

## 2023-11-02 DIAGNOSIS — O35EXX0 Maternal care for other (suspected) fetal abnormality and damage, fetal genitourinary anomalies, not applicable or unspecified: Secondary | ICD-10-CM | POA: Insufficient documentation

## 2023-11-02 DIAGNOSIS — Z207 Contact with and (suspected) exposure to pediculosis, acariasis and other infestations: Secondary | ICD-10-CM

## 2023-11-02 DIAGNOSIS — O283 Abnormal ultrasonic finding on antenatal screening of mother: Secondary | ICD-10-CM | POA: Insufficient documentation

## 2023-11-02 DIAGNOSIS — Z3480 Encounter for supervision of other normal pregnancy, unspecified trimester: Secondary | ICD-10-CM | POA: Insufficient documentation

## 2023-11-02 DIAGNOSIS — O358XX0 Maternal care for other (suspected) fetal abnormality and damage, not applicable or unspecified: Secondary | ICD-10-CM

## 2023-11-02 DIAGNOSIS — Z3A08 8 weeks gestation of pregnancy: Secondary | ICD-10-CM

## 2023-11-02 DIAGNOSIS — R7303 Prediabetes: Secondary | ICD-10-CM

## 2023-11-02 DIAGNOSIS — O99810 Abnormal glucose complicating pregnancy: Secondary | ICD-10-CM | POA: Insufficient documentation

## 2023-11-02 DIAGNOSIS — J4520 Mild intermittent asthma, uncomplicated: Secondary | ICD-10-CM

## 2023-11-02 NOTE — Progress Notes (Signed)
 Patient information  Patient Name: Joyce Howard  Patient MRN:   [id number]  Referring practice: MFM Referring Provider: [facility]  Problem List   Patient Active Problem List   Diagnosis Date Noted   Possible exposure to toxoplasma species 11/02/2023   Fetal renal anomaly, single gestation (UTDA1) 11/02/2023   Vaginal bleeding in pregnancy 09/21/2023   Supervision of other high risk pregnancy, antepartum 08/12/2023   Prediabetes 08/12/2023   Mild intermittent asthma without complication 08/12/2023   Maternal Fetal Medicine Consult SALEEMAH MOLLENHAUER is a 28 y.o. G2P0010 at [redacted]w[redacted]d here for ultrasound and consultation. She had low risk aneuploidy screening of a female fetus. Carrier screening was Negative for the basic screening (SMA, alpha-thal, beta-thal, and cystic fibroisis. Maternal serum AFP was negative. She has no acute concerns.   Today we focused on the following:   Echogenic intracardiac focus An intracardiac echogenic focus was noted on today's ultrasound. This is thought to represent a calcification of the ventricular papillary muscles.  There is no adverse cardiac function associated with this finding.  This is seen in about 3% to 4% of normal pregnancies but is higher in a pregnancy affected by Down Syndrome. Historically there was association with aneuploidy but in the presence of normal aneuploidy screening this is considered a normal variant. I reassured the patient that the likelihood of Down syndrome is extremely low (<1/10,000) based on her NIPT testing.   Concern for toxoplasmosis exposure The patient reports that she was cleaning the litter box of a newly housed stray cat during the early part of her pregnancy but she stopped approximately 6 weeks ago after she learned about the possible transmission of toxoplasmosis.  The patient denies any history of flulike illness and lymphadenopathy during pregnancy.  Maternal serum serology for  toxoplasmosis antibodies were ordered.. The fetal ultrasound was negative for stigmata consistent with congential toxoplasmosis (hydrocephaly, intracranial or hepatic calcifications, ascites, placental thickening and hyper echoic bowel and growth restriction).  I reassured the patient that the likelihood of toxoplasmosis is very low but maternal serum serology studies should be done.  Typically antibodies will develop within 2 to 3 weeks and peak after 1 to 2 months of the initial infection.  If serology shows signs of acute infection then an amniocentesis can be performed to rule out fetal infection  Toxoplasmosis is an infection that results from a parasite where the definitive host is the common house.  Human infection typically is acquired by eating raw or undercooked meat infected with the cyst from the toxoplasmosis parasite.  It can also be acquired by ingesting soil or water contaminated with the oocyte from cat feces.  The incidence of infection in [place]  is very low at 0.23 cases and 10,000 live births.  For this reason routine screening is not recommended.  However, if there is concern for infection serologic testing can be performed.  Maternal symptoms are typically subclinical but may include a flulike illness with lymphadenopathy.  In immunocompetent adults the initial infection confers lifelong immunity.  Prepregnancy infection nearly eliminates any risk of vertical transmission.  However in the immunocompromised patient the infection may be severe and reactivation may cause encephalitis, chorioretinitis or a mass lesion.  Vertical transmission can occur in those without immunity.  Fetal infection is typically characterized by intracranial lesions and growth restriction as well as an increased risk for preterm birth.  The rate of vertical transmission rises with gestational age with a risk of 15%, 44% and 71% at  13 weeks, 26 weeks and 36 weeks respectively.  While the transmission is lower in  early pregnancy, the sequelae of congenital toxoplasmosis are more severe with earlier infection.  Most infected fetuses are born without obvious toxoplasmosis stigmata.  Clinically affected neonates have generalized disease expression such as low birthweight, hepatosplenomegaly, jaundice and anemia.  Some will have neurologic disease with intracranial calcifications, hydrocephaly and microcephaly. Many will develop the classic triad of chorioretinitis, intracranial calcifications and hydrocephalus which is often accompanied by convulsions.  Screening and diagnosis is typically done with serology but can be challenging for various reasons.  IgG antibodies against of toxoplasma develop within 2 to 3 weeks after infection, peak at 1 to 2 months and usually are persistent for life sometimes even at high titer.  Although IgM antibodies appear 10 days after infection and usually become negative within 3 to 4 months, they may also remain detectable for years.  Therefore IgM antibodies are not always used alone to diagnose acute toxoplasmosis.  Avidity testing must be done for IgG antibodies to 6 determine if infection proceeded within the previous 3 to 5 months.  Since avidity increases with time, high avidity IgG excludes any infection in the 3 to 5 months prior to the test.  Low avidity indicates the infection may be recent.  Sonographic findings of congenital toxoplasmosis include hydrocephaly, intracranial or hepatic calcifications, ascites, placental thickening and hyper echoic bowel and growth restriction.  These abnormalities are similar to that seen with congenital CMV.  If congenital toxoplasmosis is suspected diagnostic testing via amniocentesis with PCR testing against the toxoplasmosis are recommended.  The sensitivity of PCR for detection of toxoplasmosis varies with gestational age and is low risk before [redacted] weeks gestation.  Fetal urinary tract dilation (UTD), A1 (mild) Mild fetal urinary tract dilation  was seen on today's ultrasound of the fetal kidneys.  The remainder of the fetal urinary to genital system appears normal including the parenchymal thickness, calyces, ureter, bladder and amniotic fluid.  I discussed the clinical significance of this finding as well as its potential association with trisomy 52.  However, the patient had low risk aneuploidy screening and therefore the risk is not increased.  The fetal kidneys will continue to be assessed at future ultrasounds.  If there is concern for fetal urinary tract dilation at or beyond [redacted] weeks gestation then a referral should be made for a pediatric urologist along with imaging of the urogenital system of the newborn.  UTD occurs in 1% to 2% of pregnancies and is most commonly a transient finding that is a normal variant. UTD may indicate renal or urinary tract pathology and may also be a marker of trisomy 21. The association between trisomy 31 and UTD has been well described in several series, and the finding of UTD confers a positive LR of 1.5, suggesting a minimal risk..  If aneuploidy screening has not been completed it should be offered to the patient.  Amniocentesis is not recommended for the sole indication of urinary tract dilation alone.  Classification of urinary tract dilation is based on the anterior-posterior renal pelvis diameter measurements.  Normal measurements include less than 4 mm if the gestational age is between 58 and 27 weeks and less than 7 mm if the gestational age is 28 weeks and beyond.  Evaluation of the calyces, parenchymal thickness/appearance, ureters, bladder and amniotic fluid should be done when there is urinary tract dilation.  The complete evaluation of the urinary tract results in the classification of A1 (  low risk) vs A2-3 (increased risk) UTD, which guides antenatal management and postnatal follow-up. UTD between 4 and 7 mm in the second trimester of pregnancy resolves in approximately 80% of cases. fetuses with  isolated UTD A1, we recommend an ultrasound examination at >=32 weeks of gestation to determine if postnatal pediatric urology or nephrology follow-up is needed. For fetuses with UTD A2-3, we recommend an individualized follow-up ultrasound assessment with planned postnatal follow-up. In a minority of cases, UTD has a pathologic cause. Common pathologic causes include vesicoureteral reflux (the most common etiology), ureteropelvic junction obstruction, ureterovesical junction obstruction, multicystic dysplastic kidneys, and posterior urethral valves. Although some conditions can be diagnosed antenatally, in most cases, the diagnosis is made postnatally. At the time of delivery, the diagnosis of UTD A1 or A2-3 should be communicated with the pediatric provider; however, resolved UTD A1 demonstrated at the third-trimester ultrasound examination requires no postnatal follow-up.   Sonographic findings Single intrauterine pregnancy at 20w 1d. Fetal cardiac activity:  Observed and appears normal. Presentation: Breech. The anatomic structures that were well seen appear normal. The anatomic survey is complete.  Fetal biometry shows the estimated fetal weight at the 17 percentile. Amniotic fluid: Within normal limits.  MVP: 6.36 cm. Placenta: Posterior. Adnexa: No abnormality visualized. Cervical length: 3.1 cm.  There are limitations of prenatal ultrasound such as the inability to detect certain abnormalities due to poor visualization. Various factors such as fetal position, gestational age and maternal body habitus may increase the difficulty in visualizing the fetal anatomy.    Recommendations -EDD should be 03/20/2024 based on  LMP  (06/14/23). -A detailed ultrasound was done today without abnormalities. -Serial growth US . -Toxo serology ordered.  -Continue routine prenatal care with referring OB provider. -If there is concern for fetal urinary tract dilation at or beyond [redacted] weeks gestation then a  referral should be made for a pediatric urologist along with imaging of the urogenital system of the newborn.    Review of Systems: A review of systems was performed and was negative except per HPI   Past Obstetrical History:  OB History  Gravida Para Term Preterm AB Living  2    1   SAB IAB Ectopic Multiple Live Births   1       # Outcome Date GA Lbr Len/2nd Weight Sex Type Anes PTL Lv  2 Current           1 IAB 2019 [redacted]w[redacted]d            Past Medical History:  Past Medical History:  Diagnosis Date   Asthma    Recurrent upper respiratory infection (URI)    Seasonal allergies      Past Surgical History:    Past Surgical History:  Procedure Laterality Date   DILATION AND CURETTAGE OF UTERUS       Home Medications:   Current Outpatient Medications on File Prior to Visit  Medication Sig Dispense Refill   albuterol (VENTOLIN HFA) 108 (90 Base) MCG/ACT inhaler Inhale 2 puffs into the lungs every 6 (six) hours as needed for wheezing or shortness of breath.     aspirin EC 81 MG tablet Take 81 mg by mouth daily. Swallow whole.     cyclobenzaprine  (FLEXERIL ) 5 MG tablet Take 1 tablet (5 mg total) by mouth 3 (three) times daily as needed for muscle spasms. 30 tablet 0   ondansetron  (ZOFRAN -ODT) 4 MG disintegrating tablet Take 1 tablet (4 mg total) by mouth every 8 (eight) hours as needed  for nausea or vomiting. 20 tablet 0   Prenatal Vit-Fe Fumarate-FA (MULTIVITAMIN-PRENATAL) 27-0.8 MG TABS tablet Take 1 tablet by mouth daily at 12 noon.     No current facility-administered medications on file prior to visit.      Allergies:   Allergies  Allergen Reactions   Mushroom Extract Complex (Obsolete) Itching, Shortness Of Breath and Swelling   Other Hives, Shortness Of Breath and Swelling     Physical Exam:   Vitals:   11/02/23 0911  BP: 98/65   Sitting comfortably on the sonogram table Nonlabored breathing Normal rate and rhythm Abdomen is nontender  Thank you for the  opportunity to be involved with this patient's care. Please let us  know if we can be of any further assistance.   60 minutes of time was spent reviewing the patient's chart including labs, imaging and documentation.  At least 50% of this time was spent with direct patient care discussing the diagnosis, management and prognosis of her care.  Delora Smaller MFM, [place]   11/02/2023  2:52 PM

## 2023-11-03 LAB — TOXOPLASMA GONDII ANTIBODY, IGG: Toxoplasma IgG Ratio: <3 [IU]/mL (ref 0.0–7.1)

## 2023-11-03 LAB — TOXOPLASMA GONDII ANTIBODY, IGM: Toxoplasma Antibody- IgM: <3 [AU]/ml (ref 0.0–7.9)

## 2023-11-03 LAB — INFECT DISEASE AB IGM REFLEX 1

## 2023-11-16 ENCOUNTER — Ambulatory Visit: Admitting: Obstetrics and Gynecology

## 2023-11-16 VITALS — BP 105/67 | HR 79 | Wt 143.0 lb

## 2023-11-16 DIAGNOSIS — O09899 Supervision of other high risk pregnancies, unspecified trimester: Secondary | ICD-10-CM | POA: Diagnosis not present

## 2023-11-16 DIAGNOSIS — Z3A22 22 weeks gestation of pregnancy: Secondary | ICD-10-CM | POA: Diagnosis not present

## 2023-11-16 DIAGNOSIS — Z207 Contact with and (suspected) exposure to pediculosis, acariasis and other infestations: Secondary | ICD-10-CM | POA: Diagnosis not present

## 2023-11-16 DIAGNOSIS — Z23 Encounter for immunization: Secondary | ICD-10-CM | POA: Diagnosis not present

## 2023-11-16 MED ORDER — ONDANSETRON 8 MG PO TBDP: 8.0000 mg | ORAL_TABLET | Freq: Three times a day (TID) | ORAL | 1 refills | Status: AC | PRN

## 2023-11-16 NOTE — Progress Notes (Signed)
   PRENATAL VISIT NOTE  Subjective:  Joyce Howard is a 28 y.o. G2P0010 at [redacted]w[redacted]d being seen today for ongoing prenatal care.  She is currently monitored for the following issues for this low-risk pregnancy and has Supervision of other high risk pregnancy, antepartum; Prediabetes; Mild intermittent asthma without complication; Vaginal bleeding in pregnancy; Possible exposure to toxoplasma species; and Fetal renal anomaly, single gestation (UTDA1) on their problem list.  Patient reports backache.  Contractions: Not present.  .  Movement: Present. Denies leaking of fluid.   The following portions of the patient's history were reviewed and updated as appropriate: allergies, current medications, past family history, past medical history, past social history, past surgical history and problem list.   Objective:    Vitals:   11/16/23 1044  BP: 105/67  Pulse: 79  Weight: 143 lb (64.9 kg)    Fetal Status:  Fetal Heart Rate (bpm): 139   Movement: Present    General: Alert, oriented and cooperative. Patient is in no acute distress.  Skin: Skin is warm and dry. No rash noted.   Cardiovascular: Normal heart rate noted  Respiratory: Normal respiratory effort, no problems with respiration noted  Abdomen: Soft, gravid, appropriate for gestational age.  Pain/Pressure: Present     Pelvic: Cervical exam deferred        Extremities: Normal range of motion.  Edema: None  Mental Status: Normal mood and affect. Normal behavior. Normal judgment and thought content.   Assessment and Plan:  Pregnancy: G2P0010 at [redacted]w[redacted]d  1. Need for influenza vaccination  - Flu vaccine trivalent PF, 6mos and older(Flulaval,Afluria,Fluarix,Fluzone)  2. [redacted] weeks gestation of pregnancy (Primary)  - Flu vaccine trivalent PF, 6mos and older(Flulaval,Afluria,Fluarix,Fluzone) - Some continued nausea, would like to try 8 mg Zofran .  - 2 hour GTT next visit.   3. Possible exposure to toxoplasma species  - Toxo serology  negative - No longer changing litter box  Preterm labor symptoms and general obstetric precautions including but not limited to vaginal bleeding, contractions, leaking of fluid and fetal movement were reviewed in detail with the patient. Please refer to After Visit Summary for other counseling recommendations.   No follow-ups on file.  Future Appointments  Date Time Provider Department Center  12/02/2023 11:15 AM [facility] PROVIDER 1 [facility]  12/02/2023 11:30 AM [facility] US1 [facility]  12/14/2023  8:30 AM Zelma Snead, Delon FERNS, NP [facility]  12/28/2023 11:15 AM [facility] PROVIDER 1 [facility]  12/28/2023 11:30 AM [facility] US1 [facility]    Delon Emms, NP

## 2023-12-02 ENCOUNTER — Ambulatory Visit: Attending: Maternal & Fetal Medicine | Admitting: Maternal & Fetal Medicine

## 2023-12-02 ENCOUNTER — Ambulatory Visit (HOSPITAL_BASED_OUTPATIENT_CLINIC_OR_DEPARTMENT_OTHER)

## 2023-12-02 ENCOUNTER — Other Ambulatory Visit: Payer: Self-pay | Admitting: Maternal & Fetal Medicine

## 2023-12-02 ENCOUNTER — Other Ambulatory Visit: Payer: Self-pay | Admitting: *Deleted

## 2023-12-02 DIAGNOSIS — Z362 Encounter for other antenatal screening follow-up: Secondary | ICD-10-CM | POA: Diagnosis not present

## 2023-12-02 DIAGNOSIS — O283 Abnormal ultrasonic finding on antenatal screening of mother: Secondary | ICD-10-CM | POA: Insufficient documentation

## 2023-12-02 DIAGNOSIS — O358XX0 Maternal care for other (suspected) fetal abnormality and damage, not applicable or unspecified: Secondary | ICD-10-CM | POA: Diagnosis present

## 2023-12-02 DIAGNOSIS — Z3A24 24 weeks gestation of pregnancy: Secondary | ICD-10-CM

## 2023-12-02 DIAGNOSIS — O99810 Abnormal glucose complicating pregnancy: Secondary | ICD-10-CM

## 2023-12-02 DIAGNOSIS — O365920 Maternal care for other known or suspected poor fetal growth, second trimester, not applicable or unspecified: Secondary | ICD-10-CM

## 2023-12-02 DIAGNOSIS — O35BXX0 Maternal care for other (suspected) fetal abnormality and damage, fetal cardiac anomalies, not applicable or unspecified: Secondary | ICD-10-CM

## 2023-12-02 DIAGNOSIS — O365990 Maternal care for other known or suspected poor fetal growth, unspecified trimester, not applicable or unspecified: Secondary | ICD-10-CM

## 2023-12-02 NOTE — Progress Notes (Signed)
 Patient information  Patient Name: Joyce Howard  Patient MRN:   [id number]  Referring practice: MFM Referring Provider: [facility]  Problem List   Patient Active Problem List   Diagnosis Date Noted   Possible exposure to toxoplasma species 11/02/2023   Fetal renal anomaly, single gestation (UTDA1) 11/02/2023   Vaginal bleeding in pregnancy 09/21/2023   Supervision of other high risk pregnancy, antepartum 08/12/2023   Prediabetes 08/12/2023   Mild intermittent asthma without complication 08/12/2023   Maternal Fetal medicine Consult  Joyce Howard is a 28 y.o. G2P0010 at [redacted]w[redacted]d here for ultrasound and consultation. Yiselle Feimster is doing well today with no acute concerns. Today we focused on the following:   Fetal growth restriction  I discussed the diagnosis, management and prognosis of fetal growth restriction (FGR) with the patient today. I discussed the various causes of growth restriction including constitutionally small fetus, placental insufficiency, genetic problems and chronic maternal disease. Currently there is no evidence of sonographic stigmata suggesting infection or aneuploidy.  I discussed the most likely cause of her fetal growth restriction is either a constitutionally small fetus or placental insufficiency. We discussed it is often challenging to differentiate between a fetus that is constitutionally small but is fulfilling its growth potential and a fetus that is not fulfilling its growth potential because of an underlying pathologic condition. Approximately 70% of fetuses with birth weight below the 10th percentile for gestational age are constitutionally small; in the remaining 30%, the cause of the small size is pathologic, meeting intrauterine growth restriction definition.  However, the smaller the fetus in the earlier onset of growth restriction the more likely there is a pathologic process causing the growth restriction.  We also  discussed the potential for genetic or infectious causes.  The patient will follow-up in 3 weeks for growth ultrasound and UA Dopplers.  At that time we can further decide what the best course of action is for the pregnancy.  I discussed the importance of monitoring fetal movement following up with her OB provider.   Recommendations -Growth ultrasound and UA Dopplers in 3 weeks. -Fetal movement precautions given.  The patient had time to ask questions that were answered to her satisfaction.  She verbalized understanding and agrees to proceed with the plan below.  Review of Systems: A review of systems was performed and was negative except per HPI   Vitals and Physical Exam    11/16/2023   10:44 AM 11/02/2023    9:11 AM 10/19/2023   10:33 AM  Vitals with BMI  Weight 143 lbs  141 lbs  Systolic 105 98 106  Diastolic 67 65 71  Pulse 79  91    Sitting comfortably on the sonogram table Nonlabored breathing Normal rate and rhythm Abdomen is nontender  Past pregnancies OB History  Gravida Para Term Preterm AB Living  2    1   SAB IAB Ectopic Multiple Live Births   1       # Outcome Date GA Lbr Len/2nd Weight Sex Type Anes PTL Lv  2 Current           1 IAB 2019 [redacted]w[redacted]d            I spent 30 minutes reviewing the patients chart, including labs and images as well as counseling the patient about her medical conditions. Greater than 50% of the time was spent in direct face-to-face patient counseling.  Delora Smaller  MFM, [facility]   12/02/2023  12:06  PM

## 2023-12-06 ENCOUNTER — Telehealth: Payer: Self-pay | Admitting: *Deleted

## 2023-12-06 NOTE — Telephone Encounter (Signed)
 Returned call from 12/03/2023 at 12:43 PM, office closes at 12:00 on Fridays. Patient given detailed appointment information. Patient also has a question for the provider. Patient advised to send provider a MyChart message for best response time.

## 2023-12-14 ENCOUNTER — Ambulatory Visit (INDEPENDENT_AMBULATORY_CARE_PROVIDER_SITE_OTHER): Admitting: Obstetrics and Gynecology

## 2023-12-14 VITALS — BP 100/70 | HR 79 | Wt 152.0 lb

## 2023-12-14 DIAGNOSIS — Z3A26 26 weeks gestation of pregnancy: Secondary | ICD-10-CM | POA: Diagnosis not present

## 2023-12-14 DIAGNOSIS — Z207 Contact with and (suspected) exposure to pediculosis, acariasis and other infestations: Secondary | ICD-10-CM | POA: Diagnosis not present

## 2023-12-14 DIAGNOSIS — O365990 Maternal care for other known or suspected poor fetal growth, unspecified trimester, not applicable or unspecified: Secondary | ICD-10-CM | POA: Insufficient documentation

## 2023-12-14 DIAGNOSIS — O365920 Maternal care for other known or suspected poor fetal growth, second trimester, not applicable or unspecified: Secondary | ICD-10-CM | POA: Diagnosis not present

## 2023-12-14 NOTE — Patient Instructions (Signed)

## 2023-12-14 NOTE — Progress Notes (Signed)
 Pt stated having pelvic pain when getting up and gluteal cramps on the left side.

## 2023-12-14 NOTE — Progress Notes (Signed)
 PRENATAL VISIT NOTE  Subjective:  Joyce Howard is a 28 y.o. G2P0010 at [redacted]w[redacted]d being seen today for ongoing prenatal care.  She is currently monitored for the following issues for this low-risk pregnancy and has Supervision of other high risk pregnancy, antepartum; Prediabetes; Mild intermittent asthma without complication; Vaginal bleeding in pregnancy; Possible exposure to toxoplasma species; and IUGR (intrauterine growth restriction) affecting care of mother on their problem list.  Patient reports no complaints.   . Vag. Bleeding: None.  Movement: Present. Denies leaking of fluid.   The following portions of the patient's history were reviewed and updated as appropriate: allergies, current medications, past family history, past medical history, past social history, past surgical history and problem list.   Objective:   Vitals:   12/14/23 0835  BP: 100/70  Pulse: 79  Weight: 152 lb (68.9 kg)    Fetal Status:  Fetal Heart Rate (bpm): 152   Movement: Present    General: Alert, oriented and cooperative. Patient is in no acute distress.  Skin: Skin is warm and dry. No rash noted.   Cardiovascular: Normal heart rate noted  Respiratory: Normal respiratory effort, no problems with respiration noted  Abdomen: Soft, gravid, appropriate for gestational age.  Pain/Pressure: Absent     Pelvic: Cervical exam deferred        Extremities: Normal range of motion.  Edema: None  Mental Status: Normal mood and affect. Normal behavior. Normal judgment and thought content.      08/12/2023    1:53 PM  Depression screen PHQ 2/9  Decreased Interest 1  Down, Depressed, Hopeless 0  PHQ - 2 Score 1  Altered sleeping 0  Tired, decreased energy 1  Change in appetite 0  Feeling bad or failure about yourself  0  Trouble concentrating 0  Moving slowly or fidgety/restless 0  Suicidal thoughts 0  PHQ-9 Score 2      Data saved with a previous flowsheet row definition        08/12/2023    1:53  PM  GAD 7 : Generalized Anxiety Score  Nervous, Anxious, on Edge 1  Control/stop worrying 0  Worry too much - different things 0  Trouble relaxing 0  Restless 0  Easily annoyed or irritable 0  Afraid - awful might happen 0  Total GAD 7 Score 1    Assessment and Plan:  Pregnancy: G2P0010 at [redacted]w[redacted]d 1. [redacted] weeks gestation of pregnancy (Primary)  - Glucose Tolerance, 2 Hours w/1 Hour - CBC - HIV Antibody (routine testing w rflx) - RPR  2. Supervision of other high risk pregnancy, antepartum  - Glucose Tolerance, 2 Hours w/1 Hour - CBC - HIV Antibody (routine testing w rflx) - RPR  3. Possible exposure to toxoplasma species  - No further exposure, previous labs normal. Patient requests repeat.  - Toxoplasma gondii antibody, IgG - Toxoplasma gondii antibody, IgM  4. Poor fetal growth affecting management of mother in second trimester, single or unspecified fetus  Normal dopplers Keep f/u with MFM Reports good fetal movement Fundal height is 27 cm today. BP normal  Preterm labor symptoms and general obstetric precautions including but not limited to vaginal bleeding, contractions, leaking of fluid and fetal movement were reviewed in detail with the patient. Please refer to After Visit Summary for other counseling recommendations.   No follow-ups on file.  Future Appointments  Date Time Provider Department Center  12/28/2023 11:15 AM [facility] PROVIDER 1 [facility]  12/28/2023 11:30 AM [facility] US1 [facility]  Delon Emms, NP

## 2023-12-15 LAB — GLUCOSE TOLERANCE, 2 HOURS W/ 1HR
Glucose, 1 hour: 116 mg/dL (ref 70–179)
Glucose, 2 hour: 131 mg/dL (ref 70–152)
Glucose, Fasting: 76 mg/dL (ref 70–91)

## 2023-12-15 LAB — CBC
Hematocrit: 32.4 % — ABNORMAL LOW (ref 34.0–46.6)
Hemoglobin: 10.5 g/dL — ABNORMAL LOW (ref 11.1–15.9)
MCH: 28.4 pg (ref 26.6–33.0)
MCHC: 32.4 g/dL (ref 31.5–35.7)
MCV: 88 fL (ref 79–97)
Platelets: 293 x10E3/uL (ref 150–450)
RBC: 3.7 x10E6/uL — ABNORMAL LOW (ref 3.77–5.28)
RDW: 14.3 % (ref 11.7–15.4)
WBC: 9.4 x10E3/uL (ref 3.4–10.8)

## 2023-12-15 LAB — INFECT DISEASE AB IGM REFLEX 1

## 2023-12-15 LAB — SYPHILIS: RPR W/REFLEX TO RPR TITER AND TREPONEMAL ANTIBODIES, TRADITIONAL SCREENING AND DIAGNOSIS ALGORITHM: RPR Ser Ql: NONREACTIVE

## 2023-12-15 LAB — TOXOPLASMA GONDII ANTIBODY, IGM: Toxoplasma Antibody- IgM: <3 [AU]/ml (ref 0.0–7.9)

## 2023-12-15 LAB — TOXOPLASMA GONDII ANTIBODY, IGG: Toxoplasma IgG Ratio: <3 [IU]/mL (ref 0.0–7.1)

## 2023-12-15 LAB — HIV ANTIBODY (ROUTINE TESTING W REFLEX): HIV Screen 4th Generation wRfx: NONREACTIVE

## 2023-12-20 ENCOUNTER — Ambulatory Visit: Payer: Self-pay | Admitting: Obstetrics and Gynecology

## 2023-12-20 ENCOUNTER — Other Ambulatory Visit: Payer: Self-pay | Admitting: Obstetrics and Gynecology

## 2023-12-20 ENCOUNTER — Encounter: Payer: Self-pay | Admitting: Obstetrics and Gynecology

## 2023-12-20 DIAGNOSIS — N912 Amenorrhea, unspecified: Secondary | ICD-10-CM | POA: Insufficient documentation

## 2023-12-20 MED ORDER — IRON 325 (65 FE) MG PO TABS: 1.0000 | ORAL_TABLET | ORAL | 1 refills | Status: AC

## 2023-12-20 NOTE — Progress Notes (Signed)
 Normal GTT Mild anemia, RX OTC iron  Delon Emms, NP

## 2023-12-27 ENCOUNTER — Ambulatory Visit: Admitting: Obstetrics & Gynecology

## 2023-12-27 VITALS — BP 95/66 | HR 88 | Wt 151.0 lb

## 2023-12-27 DIAGNOSIS — M898X1 Other specified disorders of bone, shoulder: Secondary | ICD-10-CM

## 2023-12-27 DIAGNOSIS — Z3A28 28 weeks gestation of pregnancy: Secondary | ICD-10-CM

## 2023-12-27 DIAGNOSIS — O365930 Maternal care for other known or suspected poor fetal growth, third trimester, not applicable or unspecified: Secondary | ICD-10-CM

## 2023-12-27 DIAGNOSIS — Z207 Contact with and (suspected) exposure to pediculosis, acariasis and other infestations: Secondary | ICD-10-CM

## 2023-12-27 MED ORDER — DOXYLAMINE-PYRIDOXINE 10-10 MG PO TBEC: DELAYED_RELEASE_TABLET | ORAL | 2 refills | Status: AC

## 2023-12-27 NOTE — Progress Notes (Unsigned)
 PRENATAL VISIT NOTE  Subjective:  Joyce Howard is a 28 y.o. G2P0010 at [redacted]w[redacted]d being seen today for ongoing prenatal care.  She is currently monitored for the following issues for this high-risk pregnancy and has Supervision of other high risk pregnancy, antepartum; Prediabetes; Mild intermittent asthma without complication; Vaginal bleeding in pregnancy; Possible exposure to toxoplasma species; IUGR (intrauterine growth restriction) affecting care of mother; and [place] on their problem list.  Patient reports no complaints.  Contractions: Not present. Vag. Bleeding: None.  Movement: Present. Denies leaking of fluid.   The following portions of the patient's history were reviewed and updated as appropriate: allergies, current medications, past family history, past medical history, past social history, past surgical history and problem list.   Objective:   Vitals:   12/27/23 1314  Weight: 151 lb (68.5 kg)    Fetal Status:      Movement: Present    General: Alert, oriented and cooperative. Patient is in no acute distress.  Skin: Skin is warm and dry. No rash noted.   Cardiovascular: Normal heart rate noted  Respiratory: Normal respiratory effort, no problems with respiration noted  Abdomen: Soft, gravid, appropriate for gestational age.  Pain/Pressure: Absent     Pelvic: Cervical exam deferred        Extremities: Normal range of motion.  Edema: None  Mental Status: Normal mood and affect. Normal behavior. Normal judgment and thought content.      12/27/2023    1:17 PM 08/12/2023    1:53 PM  Depression screen PHQ 2/9  Decreased Interest 0 1  Down, Depressed, Hopeless 0 0  PHQ - 2 Score 0 1  Altered sleeping 0 0  Tired, decreased energy 1 1  Change in appetite 0 0  Feeling bad or failure about yourself  0 0  Trouble concentrating 0 0  Moving slowly or fidgety/restless 0 0  Suicidal thoughts 0 0  PHQ-9 Score 1 2      Data saved with a previous flowsheet row definition         12/27/2023    1:16 PM 08/12/2023    1:53 PM  GAD 7 : Generalized Anxiety Score  Nervous, Anxious, on Edge 0 1  Control/stop worrying 0 0  Worry too much - different things 0 0  Trouble relaxing 0 0  Restless 0 0  Easily annoyed or irritable 0 0  Afraid - awful might happen 0 0  Total GAD 7 Score 0 1    Assessment and Plan:  Pregnancy: G2P0010 at [redacted]w[redacted]d 1. Poor fetal growth affecting management of mother in third trimester, single or unspecified fetus (Primary) Rpt US  tomorrow + dopplers Pt offered and accepted [facility] doula  2. Possible exposure to toxoplasma species Neg IgM and IgG in October and November  3.  Nausea/Vomiting Sometimes every day, sometimes every other day Will try Diclegis  morning and afternoon to prevent nausea Zofran  prn  4.  Upper back pain b/t scapula PT eval and treat  5.  Continue iron  every other day  Preterm labor symptoms and general obstetric precautions including but not limited to vaginal bleeding, contractions, leaking of fluid and fetal movement were reviewed in detail with the patient. Please refer to After Visit Summary for other counseling recommendations.   No follow-ups on file.  Future Appointments  Date Time Provider Department Center  12/28/2023 11:15 AM [facility] PROVIDER 1 [facility]  12/28/2023 11:30 AM [facility] US1 [facility]  01/11/2024 10:30 AM Leftwich-Kirby, Olam LABOR, CNM [facility]  01/25/2024 11:10 AM Rasch, Delon FERNS, NP [facility]  02/08/2024 10:30 AM Rasch, Delon FERNS, NP [facility]  02/22/2024 10:30 AM Rasch, Delon FERNS, NP [facility]    Burnard Pate, MD

## 2023-12-28 ENCOUNTER — Ambulatory Visit: Admitting: *Deleted

## 2023-12-28 ENCOUNTER — Other Ambulatory Visit: Payer: Self-pay

## 2023-12-28 ENCOUNTER — Ambulatory Visit

## 2023-12-28 ENCOUNTER — Ambulatory Visit: Attending: Maternal & Fetal Medicine | Admitting: Maternal & Fetal Medicine

## 2023-12-28 VITALS — BP 89/50 | HR 82

## 2023-12-28 DIAGNOSIS — Z3A28 28 weeks gestation of pregnancy: Secondary | ICD-10-CM

## 2023-12-28 DIAGNOSIS — O365930 Maternal care for other known or suspected poor fetal growth, third trimester, not applicable or unspecified: Secondary | ICD-10-CM

## 2023-12-28 DIAGNOSIS — O365990 Maternal care for other known or suspected poor fetal growth, unspecified trimester, not applicable or unspecified: Secondary | ICD-10-CM

## 2023-12-28 DIAGNOSIS — O365939 Maternal care for other known or suspected poor fetal growth, third trimester, other fetus: Secondary | ICD-10-CM

## 2023-12-28 NOTE — Progress Notes (Addendum)
   Patient information  Patient Name: Joyce Howard  Patient MRN:   [id number]  Referring practice: MFM Referring Provider: [facility]  Problem List   Patient Active Problem List   Diagnosis Date Noted   [place] 12/20/2023   IUGR (intrauterine growth restriction) affecting care of mother 12/14/2023   Possible exposure to toxoplasma species 11/02/2023   Vaginal bleeding in pregnancy 09/21/2023   Supervision of other high risk pregnancy, antepartum 08/12/2023   Prediabetes 08/12/2023   Mild intermittent asthma without complication 08/12/2023   Maternal Fetal medicine Consult  Brynda Surrette is a 28 y.o. G2P0010 at [redacted]w[redacted]d here for ultrasound and consultation. Ednah Broda is doing well today with no acute concerns. Today we focused on the following:   The patient was seen today for follow-up of fetal growth restriction, with current estimated fetal weight at the 5th percentile and abdominal circumference at the 5th percentile. Umbilical artery Dopplers and amniotic fluid are normal, and the patient reports good fetal movement. A nonstress test is indicated for ongoing surveillance. We discussed the increased risk of adverse obstetric and fetal outcomes associated with FGR, including stillbirth and the potential need for earlier delivery, emphasizing the importance of antenatal testing and serial growth ultrasounds to guide management. Based on the current reassuring Dopplers and clinical status, delivery is anticipated around 37-38 weeks, pending interval growth and testing. A previously noted fetal urinary tract dilation now appears resolved. The patient has no concerns today and verbalizes understanding of the plan.  Recommendations -NST weekly -Umbilical artery Dopplers with testing -Serial growth ultrasounds -Daily fetal movement awareness -Delivery around 37-38 weeks, pending testing and clinical course  I spent 20 minutes reviewing the patients chart,  including labs and images as well as counseling the patient about her medical conditions. Greater than 50% of the time was spent in direct face-to-face patient counseling.  Delora Smaller  MFM, [place]   12/28/2023  12:13 PM   Review of Systems: A review of systems was performed and was negative except per HPI   Vitals and Physical Exam    12/28/2023   11:22 AM 12/27/2023    1:14 PM 12/14/2023    8:35 AM  Vitals with BMI  Weight  151 lbs 152 lbs  Systolic 89 95 100  Diastolic 50 66 70  Pulse 82 88 79    Sitting comfortably on the sonogram table Nonlabored breathing Normal rate and rhythm Abdomen is nontender  Past pregnancies OB History  Gravida Para Term Preterm AB Living  2    1   SAB IAB Ectopic Multiple Live Births   1       # Outcome Date GA Lbr Len/2nd Weight Sex Type Anes PTL Lv  2 Current           1 IAB 2019 [redacted]w[redacted]d            Future Appointments  Date Time Provider Department Center  01/11/2024 10:30 AM Milly Olam LABOR CNM [facility]  01/25/2024 11:10 AM Rasch, Delon FERNS, NP [facility]  02/08/2024 10:30 AM Rasch, Delon FERNS, NP [facility]  02/22/2024 10:30 AM Rasch, Delon FERNS, NP [facility]

## 2023-12-28 NOTE — Procedures (Signed)
 Joyce Howard 03-16-1995 [redacted]w[redacted]d  Fetus A Non-Stress Test Interpretation for 12/28/23  Indication: IUGR  Fetal Heart Rate A Mode: External Baseline Rate (A): 135 bpm Variability: Moderate Accelerations: 15 x 15 Decelerations: None Multiple birth?: No  Uterine Activity Mode: Toco Contraction Frequency (min): none Resting Tone Palpated: Relaxed  Interpretation (Fetal Testing) Nonstress Test Interpretation: Reactive Comments: Tracing reviewed byDr. William

## 2023-12-29 ENCOUNTER — Telehealth: Payer: Self-pay
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
## 2023-12-31 ENCOUNTER — Other Ambulatory Visit: Payer: Self-pay

## 2023-12-31 ENCOUNTER — Ambulatory Visit: Admitting: Physical Therapy

## 2023-12-31 DIAGNOSIS — R293 Abnormal posture: Secondary | ICD-10-CM | POA: Insufficient documentation

## 2023-12-31 DIAGNOSIS — Z3A28 28 weeks gestation of pregnancy: Secondary | ICD-10-CM | POA: Insufficient documentation

## 2023-12-31 DIAGNOSIS — R2689 Other abnormalities of gait and mobility: Secondary | ICD-10-CM | POA: Diagnosis present

## 2023-12-31 DIAGNOSIS — M6281 Muscle weakness (generalized): Secondary | ICD-10-CM | POA: Diagnosis present

## 2023-12-31 DIAGNOSIS — M25512 Pain in left shoulder: Secondary | ICD-10-CM

## 2023-12-31 DIAGNOSIS — M898X1 Other specified disorders of bone, shoulder: Secondary | ICD-10-CM | POA: Insufficient documentation
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
## 2024-01-05 ENCOUNTER — Ambulatory Visit

## 2024-01-05 DIAGNOSIS — R293 Abnormal posture: Secondary | ICD-10-CM

## 2024-01-05 DIAGNOSIS — M25512 Pain in left shoulder: Secondary | ICD-10-CM | POA: Diagnosis not present

## 2024-01-05 DIAGNOSIS — R2689 Other abnormalities of gait and mobility: Secondary | ICD-10-CM

## 2024-01-05 DIAGNOSIS — M6281 Muscle weakness (generalized): Secondary | ICD-10-CM
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
## 2024-01-07 ENCOUNTER — Ambulatory Visit: Admitting: Rehabilitative and Restorative Service Providers"

## 2024-01-07 ENCOUNTER — Encounter: Payer: Self-pay | Admitting: Rehabilitative and Restorative Service Providers"

## 2024-01-07 DIAGNOSIS — R293 Abnormal posture: Secondary | ICD-10-CM

## 2024-01-07 DIAGNOSIS — M6281 Muscle weakness (generalized): Secondary | ICD-10-CM

## 2024-01-07 DIAGNOSIS — R2689 Other abnormalities of gait and mobility: Secondary | ICD-10-CM

## 2024-01-07 DIAGNOSIS — M25512 Pain in left shoulder: Secondary | ICD-10-CM | POA: Diagnosis not present
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
## 2024-01-10 ENCOUNTER — Ambulatory Visit

## 2024-01-10 DIAGNOSIS — M25512 Pain in left shoulder: Secondary | ICD-10-CM

## 2024-01-10 DIAGNOSIS — R293 Abnormal posture: Secondary | ICD-10-CM

## 2024-01-10 DIAGNOSIS — M6281 Muscle weakness (generalized): Secondary | ICD-10-CM

## 2024-01-10 DIAGNOSIS — R2689 Other abnormalities of gait and mobility: Secondary | ICD-10-CM
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
## 2024-01-11 ENCOUNTER — Ambulatory Visit: Admitting: Advanced Practice Midwife

## 2024-01-11 ENCOUNTER — Encounter: Payer: Self-pay | Admitting: Advanced Practice Midwife

## 2024-01-11 VITALS — BP 103/71 | HR 87 | Wt 152.0 lb

## 2024-01-11 DIAGNOSIS — M7918 Myalgia, other site: Secondary | ICD-10-CM | POA: Diagnosis not present

## 2024-01-11 DIAGNOSIS — O09899 Supervision of other high risk pregnancies, unspecified trimester: Secondary | ICD-10-CM

## 2024-01-11 DIAGNOSIS — O365930 Maternal care for other known or suspected poor fetal growth, third trimester, not applicable or unspecified: Secondary | ICD-10-CM | POA: Diagnosis not present

## 2024-01-11 DIAGNOSIS — O99891 Other specified diseases and conditions complicating pregnancy: Secondary | ICD-10-CM

## 2024-01-11 DIAGNOSIS — Z3A30 30 weeks gestation of pregnancy: Secondary | ICD-10-CM

## 2024-01-11 DIAGNOSIS — O09893 Supervision of other high risk pregnancies, third trimester: Secondary | ICD-10-CM

## 2024-01-11 NOTE — Progress Notes (Signed)
 ROB  Pt states she now has a doula. She has no concerns today.

## 2024-01-11 NOTE — Progress Notes (Addendum)
" ° °  PRENATAL VISIT NOTE  Subjective:  Joyce Howard is a 28 y.o. G2P0010 at [redacted]w[redacted]d being seen today for ongoing prenatal care.  She is currently monitored for the following issues for this high-risk pregnancy and has Supervision of other high risk pregnancy, antepartum; Prediabetes; Mild intermittent asthma without complication; Vaginal bleeding in pregnancy; Possible exposure to toxoplasma species; IUGR (intrauterine growth restriction) affecting care of mother; and [place] on their problem list.  Patient reports pelvic pain.   Contractions: Not present. Vag. Bleeding: None.  Movement: Present. Denies leaking of fluid.   The following portions of the patient's history were reviewed and updated as appropriate: allergies, current medications, past family history, past medical history, past social history, past surgical history and problem list.   Objective:    Vitals:   01/11/24 1038  BP: 103/71  Pulse: 87  Weight: 68.9 kg    Fetal Status:  Fetal Heart Rate (bpm): 143   Movement: Present    FH 28  General: Alert, oriented and cooperative. Patient is in no acute distress.  Skin: Skin is warm and dry. No rash noted.   Cardiovascular: Normal heart rate noted  Respiratory: Normal respiratory effort, no problems with respiration noted  Abdomen: Soft, gravid, appropriate for gestational age.  Pain/Pressure: Present     Pelvic: Cervical exam deferred        Extremities: Normal range of motion.  Edema: None  Mental Status: Normal mood and affect. Normal behavior. Normal judgment and thought content.   Assessment and Plan:  Pregnancy: G2P0010 at [redacted]w[redacted]d There are no diagnoses linked to this encounter. Preterm labor symptoms and general obstetric precautions including but not limited to vaginal bleeding, contractions, leaking of fluid and fetal movement were reviewed in detail with the patient. Please refer to After Visit Summary for other counseling recommendations.   1. Poor fetal growth  affecting management of mother in third trimester, single or unspecified fetus (Primary) -Will start antenatal testing/dopplers at 32wks  2. Supervision of other high risk pregnancy, antepartum -Feeling well today. BP, HR, FHR appropriate. Endorses fetal movement.   3. [redacted] weeks gestation of pregnancy  4. Pain in symphysis pubis during pregnancy - Discussed maternity belt, information for aeroflow given  -Sees PT for shoulder pain, may consider pelvic floor therapy    No follow-ups on file.  Future Appointments  Date Time Provider Department Center  01/12/2024  8:45 AM Tiffany Lamarr RAMAN, PTA [facility]  01/18/2024  8:45 AM Tiffany Lamarr RAMAN, PTA [facility]  01/21/2024  8:00 AM Tiffany Lamarr RAMAN, PTA [facility]  01/25/2024 11:10 AM Rasch, Delon FERNS, NP [facility]  02/08/2024 10:30 AM Rasch, Delon FERNS, NP [facility]  02/22/2024 10:30 AM Rasch, Delon FERNS, NP [facility]    Rolin Amel S-WHNP   Midwife Attestation:  I personally saw and evaluated the patient, performing the key elements of the service. I developed and verified the management plan that is described in the resident's/student's note, and I agree with the content with my edits above. VSS, HRR&R, Resp unlabored, Legs neg.    Olam Boards, CNM 5:33 PM    "

## 2024-01-12 ENCOUNTER — Ambulatory Visit

## 2024-01-12 DIAGNOSIS — M25512 Pain in left shoulder: Secondary | ICD-10-CM

## 2024-01-12 DIAGNOSIS — R2689 Other abnormalities of gait and mobility: Secondary | ICD-10-CM

## 2024-01-12 DIAGNOSIS — R293 Abnormal posture: Secondary | ICD-10-CM

## 2024-01-12 DIAGNOSIS — M6281 Muscle weakness (generalized): Secondary | ICD-10-CM
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
## 2024-01-17 ENCOUNTER — Encounter (HOSPITAL_COMMUNITY): Payer: Self-pay | Admitting: Obstetrics and Gynecology

## 2024-01-17 ENCOUNTER — Inpatient Hospital Stay (HOSPITAL_COMMUNITY)
Admission: AD | Admit: 2024-01-17 | Discharge: 2024-01-17 | Disposition: A | Discharge status: Home / Self Care | Attending: Obstetrics & Gynecology | Admitting: Obstetrics & Gynecology

## 2024-01-17 ENCOUNTER — Telehealth: Payer: Self-pay

## 2024-01-17 DIAGNOSIS — Z3689 Encounter for other specified antenatal screening: Secondary | ICD-10-CM | POA: Diagnosis not present

## 2024-01-17 DIAGNOSIS — O368130 Decreased fetal movements, third trimester, not applicable or unspecified: Secondary | ICD-10-CM | POA: Insufficient documentation

## 2024-01-17 DIAGNOSIS — Z3A31 31 weeks gestation of pregnancy: Secondary | ICD-10-CM
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
## 2024-01-17 NOTE — MAU Note (Signed)
 Joyce Howard is a 28 y.o. at [redacted]w[redacted]d here in MAU reporting: not feeling baby move much for the past 2 days. Denies any pain discomfort. Denies any vag bleeding or discharge.   LMP:  Onset of complaint: 2 days Pain score: 0  Vitals:   01/17/24 1607  BP: 101/69  Pulse: 83  Resp: 18  Temp: 98.5 F (36.9 C)     FHT: 140  Lab orders placed from triage:

## 2024-01-17 NOTE — Telephone Encounter (Signed)
 RN returned patient call from Access Nurse notification from 01/11/24. Pt reported drank bad coconut water, spoke with Poison Control with recommendations. Pt reported did not get sick and has been fine since. While on phone, patient then reported had good fetal movement on Saturday, then noticed a change on Sunday, just turning, not really kicking as much. Pt reported still having decreased fetal movement today. Pt reported last movement 5 minutes ago, but not kicking like usual. RN advised for patient to go to MAU to be evaluated, address given. Patient verbalized understanding.  Silvano LELON Piano, RN

## 2024-01-17 NOTE — MAU Provider Note (Signed)
 " History     CSN: [id number]  Arrival date and time: 01/17/24 1554   Event Date/Time   First Provider Initiated Contact with Patient 01/17/24 1740     Chief Complaint  Patient presents with   Decreased Fetal Movement   HPI Ms. Joyce Howard is a 28 y.o. year old [id number] female at [redacted]w[redacted]d weeks gestation who presents to MAU reporting not feeling any fetal movement for 2 days.  She reports that she is used to feeling him move all the time.  She became worried when she noticed that she had not felt her move since around Saturday. She denies any pain, vaginal bleeding or abnormal vaginal discharge.  [facility]. Her mother is present and contributing to the history taking.    OB History     Gravida  2   Para      Term      Preterm      AB  1   Living         SAB      IAB  1   Ectopic      Multiple      Live Births              Past Medical History:  Diagnosis Date   Asthma    Recurrent upper respiratory infection (URI)    Seasonal allergies     Past Surgical History:  Procedure Laterality Date   DILATION AND CURETTAGE OF UTERUS      Family History  Problem Relation Age of Onset   Stroke Father    Epilepsy Sister    Asthma Sister    Epilepsy Brother    Allergic rhinitis Neg Hx    Angioedema Neg Hx    Eczema Neg Hx    Urticaria Neg Hx     Social History[1]  Allergies: Allergies[2]  Medications Prior to Admission  Medication Sig Dispense Refill Last Dose/Taking   Ascorbic Acid (VITAMIN C PO) Take by mouth daily.   01/17/2024   aspirin EC 81 MG tablet Take 81 mg by mouth daily. Swallow whole.   01/17/2024   Doxylamine -Pyridoxine  (DICLEGIS ) 10-10 MG TBEC Take one tablet in the morning and one tablet early afternoon 60 tablet 2 01/16/2024   Ferrous Sulfate (IRON ) 325 (65 Fe) MG TABS Take 1 tablet (325 mg total) by mouth every other day. 30 tablet 1 01/17/2024    ondansetron  (ZOFRAN -ODT) 8 MG disintegrating tablet Take 1 tablet (8 mg total) by mouth every 8 (eight) hours as needed for nausea or vomiting. 20 tablet 1 01/16/2024   Prenatal Vit-Fe Fumarate-FA (MULTIVITAMIN-PRENATAL) 27-0.8 MG TABS tablet Take 1 tablet by mouth daily at 12 noon.   01/17/2024   albuterol (VENTOLIN HFA) 108 (90 Base) MCG/ACT inhaler Inhale 2 puffs into the lungs every 6 (six) hours as needed for wheezing or shortness of breath. (Patient not taking: Reported on 01/11/2024)      cyclobenzaprine  (FLEXERIL ) 5 MG tablet Take 1 tablet (5 mg total) by mouth 3 (three) times daily as needed for muscle spasms. (Patient not taking: Reported on 01/11/2024) 30 tablet 0     Review of Systems  Constitutional: Negative.   HENT: Negative.    Eyes: Negative.   Respiratory: Negative.    Cardiovascular: Negative.   Gastrointestinal: Negative.   Endocrine: Negative.   Genitourinary:        Not feeling FM x 2 days  Musculoskeletal: Negative.  Skin: Negative.   Allergic/Immunologic: Negative.   Neurological: Negative.   Hematological: Negative.   Psychiatric/Behavioral: Negative.     Physical Exam   Blood pressure 101/69, pulse 83, temperature 98.5 F (36.9 C), resp. rate 18, last menstrual period 06/14/2023.  Physical Exam Vitals and nursing note reviewed.  Constitutional:      Appearance: Normal appearance. She is normal weight.  Cardiovascular:     Rate and Rhythm: Normal rate.  Pulmonary:     Effort: Pulmonary effort is normal.  Abdominal:     Palpations: Abdomen is soft.     Comments: gravid  Genitourinary:    Comments: Not indicated Musculoskeletal:        General: Normal range of motion.  Skin:    General: Skin is warm and dry.  Neurological:     Mental Status: She is alert and oriented to person, place, and time.  Psychiatric:        Mood and Affect: Mood normal.        Behavior: Behavior normal.        Thought Content: Thought content normal.        Judgment:  Judgment normal.    REACTIVE NST - FHR: 130 bpm / moderate variability / accels present / decels absent / TOCO: none MAU Course  Procedures  MDM Prolonged NST  Assessment and Plan  1. NST (non-stress test) reactive (Primary) - Reassurance that fetal heart tracing is within normal limits - Information provided on fetal kick counts  2. [redacted] weeks gestation of pregnancy   - Discharge home - Keep scheduled appt with [facility] on 01/27/2024 - Patient verbalized an understanding of the plan of care and agrees.   Ala Cart, CNM 01/17/2024, 5:49 PM      [1]  Social History Tobacco Use   Smoking status: Former    Types: Cigarettes   Smokeless tobacco: Never  Vaping Use   Vaping status: Never Used  Substance Use Topics   Alcohol use: Not Currently   Drug use: Not Currently  [2]  Allergies Allergen Reactions   Mushroom Extract Complex (Obsolete) Itching, Shortness Of Breath and Swelling   Other Hives, Shortness Of Breath and Swelling   "

## 2024-01-18 ENCOUNTER — Ambulatory Visit: Admitting: Rehabilitative and Restorative Service Providers"

## 2024-01-18 ENCOUNTER — Encounter: Payer: Self-pay | Admitting: Rehabilitative and Restorative Service Providers"

## 2024-01-18 DIAGNOSIS — R293 Abnormal posture: Secondary | ICD-10-CM

## 2024-01-18 DIAGNOSIS — M25512 Pain in left shoulder: Secondary | ICD-10-CM | POA: Diagnosis not present

## 2024-01-18 DIAGNOSIS — R2689 Other abnormalities of gait and mobility: Secondary | ICD-10-CM

## 2024-01-18 DIAGNOSIS — M6281 Muscle weakness (generalized): Secondary | ICD-10-CM

## 2024-01-21 ENCOUNTER — Ambulatory Visit: Attending: Obstetrics & Gynecology

## 2024-01-21 DIAGNOSIS — M898X1 Other specified disorders of bone, shoulder: Secondary | ICD-10-CM | POA: Insufficient documentation

## 2024-01-21 DIAGNOSIS — Z3A28 28 weeks gestation of pregnancy: Secondary | ICD-10-CM | POA: Insufficient documentation

## 2024-01-21 DIAGNOSIS — M6281 Muscle weakness (generalized): Secondary | ICD-10-CM | POA: Insufficient documentation

## 2024-01-21 DIAGNOSIS — R2689 Other abnormalities of gait and mobility: Secondary | ICD-10-CM | POA: Insufficient documentation

## 2024-01-21 DIAGNOSIS — R293 Abnormal posture: Secondary | ICD-10-CM | POA: Insufficient documentation

## 2024-01-21 DIAGNOSIS — M25512 Pain in left shoulder: Secondary | ICD-10-CM | POA: Insufficient documentation

## 2024-01-24 ENCOUNTER — Telehealth: Payer: Self-pay

## 2024-01-24 NOTE — Telephone Encounter (Signed)
 RN received Access Nurse message from patient with call in yesterday. Pt reported difficulty breathing, fatigue, vomiting, headache. Pt reported still congested and slight headache today, denied any fever. Pt reported good fetal movement. Pt reported has not tried any medications for symptoms because was unsure of what could take, safe otc medications list sent to patient via mychart. Pt reported initially felt like getting worse, but starting to feel a little better today. RN reviewed if symptoms continue and or worsen to be evaluated or has SOB. Pt verbalized understanding.  Silvano LELON Piano, RN

## 2024-01-25 ENCOUNTER — Ambulatory Visit: Attending: Maternal & Fetal Medicine

## 2024-01-25 ENCOUNTER — Ambulatory Visit (HOSPITAL_BASED_OUTPATIENT_CLINIC_OR_DEPARTMENT_OTHER): Admitting: Obstetrics

## 2024-01-25 ENCOUNTER — Encounter: Admitting: Obstetrics and Gynecology

## 2024-01-25 VITALS — BP 95/64

## 2024-01-25 DIAGNOSIS — O365930 Maternal care for other known or suspected poor fetal growth, third trimester, not applicable or unspecified: Secondary | ICD-10-CM

## 2024-01-25 DIAGNOSIS — O35BXX0 Maternal care for other (suspected) fetal abnormality and damage, fetal cardiac anomalies, not applicable or unspecified: Secondary | ICD-10-CM

## 2024-01-25 DIAGNOSIS — O358XX0 Maternal care for other (suspected) fetal abnormality and damage, not applicable or unspecified: Secondary | ICD-10-CM

## 2024-01-25 DIAGNOSIS — O99810 Abnormal glucose complicating pregnancy: Secondary | ICD-10-CM | POA: Diagnosis not present

## 2024-01-25 DIAGNOSIS — O283 Abnormal ultrasonic finding on antenatal screening of mother: Secondary | ICD-10-CM | POA: Diagnosis not present

## 2024-01-25 DIAGNOSIS — O4690 Antepartum hemorrhage, unspecified, unspecified trimester: Secondary | ICD-10-CM | POA: Insufficient documentation

## 2024-01-25 DIAGNOSIS — N912 Amenorrhea, unspecified: Secondary | ICD-10-CM | POA: Diagnosis present

## 2024-01-25 DIAGNOSIS — O365939 Maternal care for other known or suspected poor fetal growth, third trimester, other fetus: Secondary | ICD-10-CM

## 2024-01-25 DIAGNOSIS — Z3A32 32 weeks gestation of pregnancy: Secondary | ICD-10-CM

## 2024-01-25 NOTE — Progress Notes (Signed)
" ° ° °  PRENATAL VISIT NOTE  Subjective:  Joyce Howard is a 29 y.o. G2P0010 at [redacted]w[redacted]d being seen today for ongoing prenatal care.  She is currently monitored for the following issues for this high-risk pregnancy and has Supervision of other high risk pregnancy, antepartum; Prediabetes; Mild intermittent asthma without complication; Vaginal bleeding in pregnancy; Possible exposure to toxoplasma species; IUGR (intrauterine growth restriction) affecting care of mother; and [place] on their problem list.  Patient reports congestion since last Thursday. Has caused poor sleep and fatigue due to this. Feels like it is due to a cold. Has not yet tried anything OTC.   Contractions: Not present. Vag. Bleeding: None.  Movement: Present. Denies leaking of fluid.   The following portions of the patient's history were reviewed and updated as appropriate: allergies, current medications, past family history, past medical history, past social history, past surgical history and problem list.   Objective:   Vitals:   01/27/24 1000  BP: 103/69  Pulse: 94  Weight: 155 lb (70.3 kg)    Fetal Status:  Fetal Heart Rate (bpm): 138   Movement: Present    General: Alert, oriented and cooperative. Patient is in no acute distress.  Skin: Skin is warm and dry. No rash noted.   Cardiovascular: Normal heart rate noted  Respiratory: Normal respiratory effort, no problems with respiration noted  Abdomen: Soft, gravid, appropriate for gestational age.  Pain/Pressure: Absent     Pelvic: Cervical exam deferred        Extremities: Normal range of motion.  Edema: None  Mental Status: Normal mood and affect. Normal behavior. Normal judgment and thought content.   Assessment and Plan:  Pregnancy: G2P0010 at [redacted]w[redacted]d Poor fetal growth affecting management of mother in third trimester, single or unspecified fetus  Supervision of other high risk pregnancy, antepartum Overview: - Offer TDAP today - declines - Discuss RSV next  visit - Reviewed MOC: Condoms. But will consider nonhormonal IUD. Discussed option for PP vs 6 wk timing of insertion.   Pregnancy with 32 completed weeks gestation  Fetal growth restriction - Discussed timing of induction recommendation will be based on final growth on 1/27. She does not want 37w IOL. We discussed depending on parameters, we will discuss risks and benefits of IOL vs remaining pregnant.  - Msg sent to MFM - she is not yet scheduled next week for her dopplers.    Prediabetes Overview: A1C was 5.7 at NOB > Early 2 hr normal 2 hr normal at 28w  [place] Overview: 12/13/24 10.5 at 28 week labs > Started on PO iron .  [x]  anemia panel and repeat CBC   Possible exposure to toxoplasma species Overview: [x]  f/u toxo serologies--neg IgM,IgG October and November   Preterm labor symptoms and general obstetric precautions including but not limited to vaginal bleeding, contractions, leaking of fluid and fetal movement were reviewed in detail with the patient. Please refer to After Visit Summary for other counseling recommendations.   No follow-ups on file.  Future Appointments  Date Time Provider Department Center  02/08/2024 10:30 AM Rasch, Delon FERNS, NP [facility]  02/15/2024 11:15 AM [facility] PROVIDER 1 [facility]  02/15/2024 11:30 AM [facility] US6 [facility]  02/22/2024 10:30 AM Rasch, Delon FERNS, NP [facility]    Vina Solian, MD "

## 2024-01-25 NOTE — Progress Notes (Signed)
 MFM Consult Note  Joyce Howard is currently at [redacted]w[redacted]d. She has been followed due to IUGR.  She denies any problems since her last exam and reports feeling fetal movements throughout the day.    Her blood pressure today was 95/64.  Sonographic findings Single intrauterine pregnancy at 32w 1d. Fetal cardiac activity:  Observed and appears normal. Presentation: Cephalic. Fetal biometry shows the estimated fetal weight of 3 lb 8 oz, 1594 grams (6%), indicating IUGR. Amniotic fluid: Within normal limits. AFI: 10.13cm.   MVP: 3.08 cm. Placenta: Posterior. BPP 8/8.    Doppler studies of the umbilical arteries performed today showed a normal S/D ratio of 2.58.  There were no signs of absent or reversed end-diastolic flow.  Due to IUGR, we will continue to follow her with weekly fetal testing and umbilical artery Doppler studies.    Should IUGR continue to be noted later in her pregnancy, delivery will be recommended at around 38 weeks.  She will return in 1 week for another BPP and umbilical artery Doppler study.   The patient stated that all of her questions were answered.   A total of 20 minutes was spent counseling and coordinating the care for this patient.  Greater than 50% of the time was spent in direct face-to-face contact.

## 2024-01-25 NOTE — Assessment & Plan Note (Deleted)
 A1C was 5.7 at NOB > Early 2 hr normal 2 hr normal at 28w

## 2024-01-27 ENCOUNTER — Ambulatory Visit: Admitting: Obstetrics and Gynecology

## 2024-01-27 VITALS — BP 103/69 | HR 94 | Wt 155.0 lb

## 2024-01-27 DIAGNOSIS — O09899 Supervision of other high risk pregnancies, unspecified trimester: Secondary | ICD-10-CM | POA: Diagnosis not present

## 2024-01-27 DIAGNOSIS — Z207 Contact with and (suspected) exposure to pediculosis, acariasis and other infestations: Secondary | ICD-10-CM

## 2024-01-27 DIAGNOSIS — Z3A32 32 weeks gestation of pregnancy: Secondary | ICD-10-CM | POA: Diagnosis not present

## 2024-01-27 DIAGNOSIS — R7303 Prediabetes: Secondary | ICD-10-CM

## 2024-01-27 DIAGNOSIS — N912 Amenorrhea, unspecified: Secondary | ICD-10-CM | POA: Diagnosis not present

## 2024-01-27 DIAGNOSIS — O365930 Maternal care for other known or suspected poor fetal growth, third trimester, not applicable or unspecified: Secondary | ICD-10-CM

## 2024-01-27 MED ORDER — FLUTICASONE PROPIONATE 50 MCG/ACT NA SUSP: 1.0000 | Freq: Every day | NASAL | 2 refills | Status: AC

## 2024-01-27 MED ORDER — GUAIFENESIN ER 600 MG PO TB12: 600.0000 mg | ORAL_TABLET | Freq: Two times a day (BID) | ORAL | 0 refills | Status: AC

## 2024-01-28 LAB — IRON AND TIBC
Iron Saturation: 38 % (ref 15–55)
Iron: 186 ug/dL — ABNORMAL HIGH (ref 27–159)
Total Iron Binding Capacity: 492 ug/dL — ABNORMAL HIGH (ref 250–450)
UIBC: 306 ug/dL (ref 131–425)

## 2024-01-28 LAB — CBC
Hematocrit: 31.2 % — ABNORMAL LOW (ref 34.0–46.6)
Hemoglobin: 9.7 g/dL — ABNORMAL LOW (ref 11.1–15.9)
MCH: 27 pg (ref 26.6–33.0)
MCHC: 31.1 g/dL — ABNORMAL LOW (ref 31.5–35.7)
MCV: 87 fL (ref 79–97)
Platelets: 262 x10E3/uL (ref 150–450)
RBC: 3.59 x10E6/uL — ABNORMAL LOW (ref 3.77–5.28)
RDW: 14.6 % (ref 11.7–15.4)
WBC: 7.9 x10E3/uL (ref 3.4–10.8)

## 2024-01-28 LAB — VITAMIN B12: Vitamin B-12: 896 pg/mL (ref 232–1245)

## 2024-01-28 LAB — FOLATE: Folate: >20 ng/mL

## 2024-01-28 LAB — FERRITIN: Ferritin: 21 ng/mL (ref 15–150)

## 2024-01-31 ENCOUNTER — Ambulatory Visit: Payer: Self-pay | Admitting: Obstetrics and Gynecology

## 2024-01-31 DIAGNOSIS — N912 Amenorrhea, unspecified: Secondary | ICD-10-CM

## 2024-02-01 ENCOUNTER — Ambulatory Visit: Attending: Obstetrics and Gynecology

## 2024-02-01 ENCOUNTER — Ambulatory Visit

## 2024-02-01 VITALS — BP 96/61 | HR 93

## 2024-02-01 DIAGNOSIS — Z3A33 33 weeks gestation of pregnancy: Secondary | ICD-10-CM | POA: Diagnosis not present

## 2024-02-01 DIAGNOSIS — O365930 Maternal care for other known or suspected poor fetal growth, third trimester, not applicable or unspecified: Secondary | ICD-10-CM

## 2024-02-01 NOTE — Progress Notes (Signed)
 MFM Consult Note  Joyce Howard is currently at [redacted]w[redacted]d. She has been followed due to IUGR.    She denies any problems since her last exam and reports feeling fetal movements throughout the day.    Her blood pressure today was 96/61.  She had a reactive NST today.    A modified BPP performed today showed normal amniotic fluid with a total AFI of 11.76 cm.    The fetus was in the vertex presentation.  Fetal movements were noted throughout today's exam.  Doppler studies of the umbilical arteries performed today showed a normal S/D ratio of 2.48.  There were no signs of absent or reversed end-diastolic flow noted.    Due to IUGR, she will return in 1 week for another modified BPP and umbilical artery Doppler study.    We will reassess the fetal growth in 2 weeks.    Should IUGR continue to be noted later in her pregnancy, delivery will be recommended at around 38 weeks.  The patient stated that all of her questions were answered.   A total of 20 minutes was spent counseling and coordinating the care for this patient.  Greater than 50% of the time was spent in direct face-to-face contact.

## 2024-02-01 NOTE — Procedures (Signed)
 Joyce Howard 05-17-1995 [redacted]w[redacted]d  Fetus A Non-Stress Test Interpretation for 02/01/2024  Indication: IUGR - NST only  Fetal Heart Rate A Mode: External Baseline Rate (A): 140 bpm Variability: Moderate Accelerations: 15 x 15 Decelerations: None Multiple birth?: No  Uterine Activity Mode: Palpation, Toco Contraction Frequency (min): none noted Resting Tone Palpated: Relaxed  Interpretation (Fetal Testing) Nonstress Test Interpretation: Reactive Comments: Reviewed with Dr. Ileana

## 2024-02-08 ENCOUNTER — Ambulatory Visit

## 2024-02-08 ENCOUNTER — Ambulatory Visit: Admitting: Obstetrics and Gynecology

## 2024-02-08 ENCOUNTER — Ambulatory Visit: Attending: Obstetrics | Admitting: Obstetrics

## 2024-02-08 VITALS — BP 106/60

## 2024-02-08 VITALS — BP 94/63 | HR 96 | Wt 158.0 lb

## 2024-02-08 DIAGNOSIS — Z3A34 34 weeks gestation of pregnancy: Secondary | ICD-10-CM | POA: Diagnosis not present

## 2024-02-08 DIAGNOSIS — R000 Tachycardia, unspecified: Secondary | ICD-10-CM

## 2024-02-08 DIAGNOSIS — O365930 Maternal care for other known or suspected poor fetal growth, third trimester, not applicable or unspecified: Secondary | ICD-10-CM | POA: Insufficient documentation

## 2024-02-08 DIAGNOSIS — O09899 Supervision of other high risk pregnancies, unspecified trimester: Secondary | ICD-10-CM | POA: Diagnosis not present

## 2024-02-08 NOTE — Procedures (Signed)
 Joyce Howard [birth year] [redacted]w[redacted]d  Fetus A Non-Stress Test Interpretation for 02/08/24  Indication: IUGR - NST only  Fetal Heart Rate A Mode: External Baseline Rate (A): 140 bpm Variability: Moderate Accelerations: 15 x 15 Decelerations: None Multiple birth?: No  Uterine Activity Mode: Palpation, Toco Contraction Frequency (min): none noted Resting Tone Palpated: Relaxed  Interpretation (Fetal Testing) Nonstress Test Interpretation: Reactive Comments: Reviewed with Dr. Ileana

## 2024-02-08 NOTE — Progress Notes (Signed)
 "  PRENATAL VISIT NOTE  Subjective:  Denia Mcvicar is a 29 y.o. G2P0010 at [redacted]w[redacted]d being seen today for ongoing prenatal care.  She is currently monitored for the following issues for this low-risk pregnancy and has Supervision of other high risk pregnancy, antepartum; Prediabetes; Mild intermittent asthma without complication; Vaginal bleeding in pregnancy; Possible exposure to toxoplasma species; IUGR (intrauterine growth restriction) affecting care of mother; and [place] on their problem list.  Patient reports no complaints.  Contractions: Not present. Vag. Bleeding: None.  Movement: Present. Denies leaking of fluid.   The following portions of the patient's history were reviewed and updated as appropriate: allergies, current medications, past family history, past medical history, past social history, past surgical history and problem list.   Objective:   Vitals:   02/08/24 1049  BP: 94/63  Pulse: 96  Weight: 158 lb (71.7 kg)    Fetal Status:  Fetal Heart Rate (bpm): 132   Movement: Present    General: Alert, oriented and cooperative. Patient is in no acute distress.  Skin: Skin is warm and dry. No rash noted.   Cardiovascular: Normal heart rate noted  Respiratory: Normal respiratory effort, no problems with respiration noted  Abdomen: Soft, gravid, appropriate for gestational age.  Pain/Pressure: Absent     Pelvic: Cervical exam deferred        Extremities: Normal range of motion.  Edema: None  Mental Status: Normal mood and affect. Normal behavior. Normal judgment and thought content.      12/27/2023    1:17 PM 08/12/2023    1:53 PM  Depression screen PHQ 2/9  Decreased Interest 0 1  Down, Depressed, Hopeless 0 0  PHQ - 2 Score 0 1  Altered sleeping 0 0  Tired, decreased energy 1 1  Change in appetite 0 0  Feeling bad or failure about yourself  0 0  Trouble concentrating 0 0  Moving slowly or fidgety/restless 0 0  Suicidal thoughts 0 0  PHQ-9 Score 1 2      Data  saved with a previous flowsheet row definition        12/27/2023    1:16 PM 08/12/2023    1:53 PM  GAD 7 : Generalized Anxiety Score  Nervous, Anxious, on Edge 0  1   Control/stop worrying 0  0   Worry too much - different things 0  0   Trouble relaxing 0  0   Restless 0  0   Easily annoyed or irritable 0  0   Afraid - awful might happen 0  0   Total GAD 7 Score 0 1     Data saved with a previous flowsheet row definition    Assessment and Plan:  Pregnancy: G2P0010 at [redacted]w[redacted]d  1. Supervision of other high risk pregnancy, antepartum (Primary)  Feeling good movement.  2. Tachycardia  Feelings of elevated HR Started 1 week ago Feels symptoms multiple times per day Symptoms are worse in the evenings and during laying down. No radiation of pain down arms.  Never had these symptoms before.   Referral to [facility] cards sent.   3. Poor fetal growth affecting management of mother in third trimester, single or unspecified fetus  EFW 6%tile Fundal height 33 cm  MFM follow up today Will discuss delivery timing.  Has growth US  next week.    Preterm labor symptoms and general obstetric precautions including but not limited to vaginal bleeding, contractions, leaking of fluid and fetal movement were reviewed in detail with  the patient. Please refer to After Visit Summary for other counseling recommendations.   No follow-ups on file.  Future Appointments  Date Time Provider Department Center  02/08/2024  1:00 PM [facility] PROVIDER 1 [facility]  02/08/2024  1:15 PM [facility] NST [facility]  02/15/2024 11:15 AM [facility] PROVIDER 1 [facility]  02/15/2024 11:30 AM [facility] US6 [facility]  02/22/2024 10:30 AM Anallely Rosell, Delon FERNS, NP [facility]  02/29/2024  1:30 PM Ikenna Ohms, Delon FERNS, NP [facility]  03/07/2024  1:30 PM Rutilio Yellowhair, Delon FERNS, NP [facility]  03/14/2024  1:30 PM Kepler Mccabe, Delon FERNS, NP [facility]  03/21/2024  1:30 PM Deserie Dirks, Delon FERNS, NP [facility]  [facility]    Delon Emms, NP  "

## 2024-02-09 NOTE — Progress Notes (Signed)
 MFM Consult Note  Joyce Howard is currently at 34 weeks and 1 day. She has been followed due to IUGR.  She denies any problems since her last exam and reports feeling fetal movements throughout the day.    She had a reactive NST today.    A modified BPP performed today showed normal amniotic fluid with a total AFI of 13.04 cm.    The fetus was in the vertex presentation.  Doppler studies of the umbilical arteries showed a normal S/D ratio of 2.85.  There were no signs of absent or reversed end-diastolic flow.    Fetal movements and fetal breathing movements were noted throughout today's exam.  Due to IUGR, she will return in 1 week for a BPP, growth scan, and umbilical artery Doppler study.  Should IUGR continue to be noted next week, delivery will be recommended at around 38 weeks.  The patient stated that all of her questions were answered.   A total of 20 minutes was spent counseling and coordinating the care for this patient.  Greater than 50% of the time was spent in direct face-to-face contact.

## 2024-02-11 ENCOUNTER — Other Ambulatory Visit

## 2024-02-11 ENCOUNTER — Ambulatory Visit

## 2024-02-15 ENCOUNTER — Ambulatory Visit: Attending: Obstetrics and Gynecology

## 2024-02-15 ENCOUNTER — Ambulatory Visit (HOSPITAL_BASED_OUTPATIENT_CLINIC_OR_DEPARTMENT_OTHER): Admitting: Obstetrics

## 2024-02-15 VITALS — BP 104/64

## 2024-02-15 DIAGNOSIS — Z3A35 35 weeks gestation of pregnancy: Secondary | ICD-10-CM

## 2024-02-15 DIAGNOSIS — O99810 Abnormal glucose complicating pregnancy: Secondary | ICD-10-CM | POA: Diagnosis not present

## 2024-02-15 DIAGNOSIS — O4690 Antepartum hemorrhage, unspecified, unspecified trimester: Secondary | ICD-10-CM | POA: Diagnosis not present

## 2024-02-15 DIAGNOSIS — N912 Amenorrhea, unspecified: Secondary | ICD-10-CM | POA: Diagnosis present

## 2024-02-15 DIAGNOSIS — O365930 Maternal care for other known or suspected poor fetal growth, third trimester, not applicable or unspecified: Secondary | ICD-10-CM | POA: Diagnosis not present

## 2024-02-15 DIAGNOSIS — O283 Abnormal ultrasonic finding on antenatal screening of mother: Secondary | ICD-10-CM | POA: Diagnosis not present

## 2024-02-15 DIAGNOSIS — O358XX0 Maternal care for other (suspected) fetal abnormality and damage, not applicable or unspecified: Secondary | ICD-10-CM | POA: Diagnosis present

## 2024-02-15 DIAGNOSIS — O365939 Maternal care for other known or suspected poor fetal growth, third trimester, other fetus: Secondary | ICD-10-CM | POA: Insufficient documentation

## 2024-02-15 DIAGNOSIS — O35BXX0 Maternal care for other (suspected) fetal abnormality and damage, fetal cardiac anomalies, not applicable or unspecified: Secondary | ICD-10-CM | POA: Diagnosis not present

## 2024-02-15 NOTE — Progress Notes (Signed)
 MFM Consult Note  Joyce Howard is currently at [redacted]w[redacted]d. She has been followed due to IUGR.  She denies any problems since her last exam and reports feeling fetal movements throughout the day.   Her blood pressure today was 104/64.  Sonographic findings Single intrauterine pregnancy at 35w 1d. Fetal cardiac activity:  Observed and appears normal. Presentation: Cephalic. Fetal biometry shows the estimated fetal weight of 4 lb 10 oz, 2088 grams (5%).  The fetus grew almost 1 pound over the past 3 weeks. Amniotic fluid: Within normal limits. AFI: 11.17cm.   MVP: 4.11 cm. Placenta: Posterior. BPP 8/8.   Doppler studies of the umbilical arteries performed today showed a normal S/D ratio of 2.83.  There were no signs of absent or reversed end-diastolic flow.  Due to IUGR, we will continue to follow her with weekly fetal testing and umbilical artery Doppler studies.    The patient understands that due to IUGR, delivery will be recommended at around 38 weeks.  However, she would like to delay delivery for as long as possible.    She was advised that we can delay delivery up until 39 weeks as long as the weekly fetal testing is within normal limits.  She will return in 1 week for another BPP and umbilical artery Doppler study.   The patient stated that all of her questions were answered.   A total of 20 minutes was spent counseling and coordinating the care for this patient.  Greater than 50% of the time was spent in direct face-to-face contact.

## 2024-02-22 ENCOUNTER — Ambulatory Visit (INDEPENDENT_AMBULATORY_CARE_PROVIDER_SITE_OTHER): Admitting: Obstetrics and Gynecology

## 2024-02-22 ENCOUNTER — Telehealth: Payer: Self-pay | Admitting: *Deleted

## 2024-02-22 ENCOUNTER — Other Ambulatory Visit (HOSPITAL_COMMUNITY): Admission: RE | Admit: 2024-02-22 | Discharge: 2024-02-22 | Disposition: A | Source: Ambulatory Visit

## 2024-02-22 VITALS — BP 99/65 | HR 92 | Wt 156.0 lb

## 2024-02-22 DIAGNOSIS — O365930 Maternal care for other known or suspected poor fetal growth, third trimester, not applicable or unspecified: Secondary | ICD-10-CM

## 2024-02-22 DIAGNOSIS — Z3A36 36 weeks gestation of pregnancy: Secondary | ICD-10-CM | POA: Diagnosis not present

## 2024-02-22 DIAGNOSIS — O09899 Supervision of other high risk pregnancies, unspecified trimester: Secondary | ICD-10-CM | POA: Diagnosis not present

## 2024-02-22 DIAGNOSIS — R000 Tachycardia, unspecified: Secondary | ICD-10-CM | POA: Diagnosis not present

## 2024-02-22 NOTE — Progress Notes (Signed)
 "  PRENATAL VISIT NOTE  Subjective:  Joyce Howard is a 29 y.o. G2P0010 at [redacted]w[redacted]d being seen today for ongoing prenatal care.  She is currently monitored for the following issues for this low-risk pregnancy and has Supervision of other high risk pregnancy, antepartum; Prediabetes; Mild intermittent asthma without complication; Vaginal bleeding in pregnancy; Possible exposure to toxoplasma species; IUGR (intrauterine growth restriction) affecting care of mother; and [place] on their problem list.  Patient reports no complaints.  Contractions: Not present. Vag. Bleeding: None.  Movement: Present. Denies leaking of fluid.   The following portions of the patient's history were reviewed and updated as appropriate: allergies, current medications, past family history, past medical history, past social history, past surgical history and problem list.   Objective:   Vitals:   02/22/24 1047  BP: 99/65  Pulse: 92  Weight: 156 lb (70.8 kg)    Fetal Status:  Fetal Heart Rate (bpm): 143   Movement: Present    General: Alert, oriented and cooperative. Patient is in no acute distress.  Skin: Skin is warm and dry. No rash noted.   Cardiovascular: Normal heart rate noted  Respiratory: Normal respiratory effort, no problems with respiration noted  Abdomen: Soft, gravid, appropriate for gestational age.  Pain/Pressure: Absent     Pelvic: Cervical exam deferred        Extremities: Normal range of motion.  Edema: None  Mental Status: Normal mood and affect. Normal behavior. Normal judgment and thought content.      12/27/2023    1:17 PM 08/12/2023    1:53 PM  Depression screen PHQ 2/9  Decreased Interest 0 1  Down, Depressed, Hopeless 0 0  PHQ - 2 Score 0 1  Altered sleeping 0 0  Tired, decreased energy 1 1  Change in appetite 0 0  Feeling bad or failure about yourself  0 0  Trouble concentrating 0 0  Moving slowly or fidgety/restless 0 0  Suicidal thoughts 0 0  PHQ-9 Score 1 2      Data  saved with a previous flowsheet row definition        12/27/2023    1:16 PM 08/12/2023    1:53 PM  GAD 7 : Generalized Anxiety Score  Nervous, Anxious, on Edge 0  1   Control/stop worrying 0  0   Worry too much - different things 0  0   Trouble relaxing 0  0   Restless 0  0   Easily annoyed or irritable 0  0   Afraid - awful might happen 0  0   Total GAD 7 Score 0 1     Data saved with a previous flowsheet row definition    Assessment and Plan:  Pregnancy: G2P0010 at [redacted]w[redacted]d  1. [redacted] weeks gestation of pregnancy (Primary)  - Cervicovaginal ancillary only - Strep Gp B NAA  2. Supervision of other high risk pregnancy, antepartum  - Strep Gp B NAA  3. Tachycardia  - AMB Referral to Cardio Obstetrics- 2nd referral sent.  - symptoms not worse, however have continued.   4. Poor fetal growth affecting management of mother in third trimester, single or unspecified fetus  Continue close follow up with MFM Fundal height 35 cm Reports good fetal movement. Reviewed induction at 38-39 weeks.  EFW 5%tile Normal AFI  Doppler studies of the umbilical arteries performed today showed a normal S/D ratio of 2.83. There were no signs of absent or reversed end-diastolic flow.     Preterm labor symptoms  and general obstetric precautions including but not limited to vaginal bleeding, contractions, leaking of fluid and fetal movement were reviewed in detail with the patient. Please refer to After Visit Summary for other counseling recommendations.   No follow-ups on file.  Future Appointments  Date Time Provider Department Center  02/29/2024  1:30 PM Maritza Hosterman, Delon FERNS, NP [facility]  03/07/2024  1:30 PM Katyra Tomassetti, Delon FERNS, NP [facility]  03/14/2024  1:30 PM Chrystian Ressler, Delon FERNS, NP [facility]  03/21/2024  1:30 PM Luddie Boghosian, Delon FERNS, NP [facility]    Delon Emms, NP  "

## 2024-02-22 NOTE — Telephone Encounter (Signed)
 Left patient a message to see if she is going to be able to make her appointment today due to road conditions.

## 2024-02-23 LAB — CERVICOVAGINAL ANCILLARY ONLY
Chlamydia: NEGATIVE
Comment: NEGATIVE
Comment: NORMAL
Neisseria Gonorrhea: NEGATIVE

## 2024-02-24 LAB — STREP GP B NAA: Strep Gp B NAA: NEGATIVE

## 2024-02-29 ENCOUNTER — Encounter: Admitting: Obstetrics and Gynecology

## 2024-02-29 ENCOUNTER — Other Ambulatory Visit

## 2024-02-29 ENCOUNTER — Ambulatory Visit

## 2024-02-29 DIAGNOSIS — O4690 Antepartum hemorrhage, unspecified, unspecified trimester: Secondary | ICD-10-CM

## 2024-02-29 DIAGNOSIS — N912 Amenorrhea, unspecified: Secondary | ICD-10-CM

## 2024-02-29 DIAGNOSIS — O365930 Maternal care for other known or suspected poor fetal growth, third trimester, not applicable or unspecified: Secondary | ICD-10-CM

## 2024-03-02 ENCOUNTER — Ambulatory Visit: Admitting: Cardiology

## 2024-03-07 ENCOUNTER — Ambulatory Visit

## 2024-03-07 ENCOUNTER — Encounter: Admitting: Obstetrics and Gynecology

## 2024-03-14 ENCOUNTER — Encounter: Admitting: Obstetrics and Gynecology

## 2024-03-21 ENCOUNTER — Encounter: Admitting: Obstetrics and Gynecology
